# Patient Record
Sex: Female | Born: 1985 | Race: Black or African American | Hispanic: No | Marital: Single | Smoking: Never smoker
Health system: Southern US, Community
[De-identification: ages and names within clinical notes are randomized; demographics above are authoritative.]

## PROBLEM LIST (undated history)

## (undated) DIAGNOSIS — E042 Nontoxic multinodular goiter: Secondary | ICD-10-CM

## (undated) DIAGNOSIS — D649 Anemia, unspecified: Secondary | ICD-10-CM

## (undated) DIAGNOSIS — J189 Pneumonia, unspecified organism: Secondary | ICD-10-CM

## (undated) DIAGNOSIS — Z8619 Personal history of other infectious and parasitic diseases: Secondary | ICD-10-CM

## (undated) HISTORY — PX: FRACTURE SURGERY: SHX138

## (undated) HISTORY — DX: Nontoxic multinodular goiter: E04.2

## (undated) HISTORY — PX: APPENDECTOMY: SHX54

## (undated) HISTORY — DX: Anemia, unspecified: D64.9

## (undated) HISTORY — DX: Personal history of other infectious and parasitic diseases: Z86.19

## (undated) HISTORY — PX: HARDWARE REMOVAL: SHX979

---

## 2010-11-04 HISTORY — PX: TONSILLECTOMY AND ADENOIDECTOMY: SHX28

## 2012-04-04 LAB — HM PAP SMEAR: HM Pap smear: NORMAL

## 2012-10-07 ENCOUNTER — Other Ambulatory Visit (HOSPITAL_COMMUNITY): Payer: Self-pay | Admitting: Internal Medicine

## 2012-10-07 DIAGNOSIS — E042 Nontoxic multinodular goiter: Secondary | ICD-10-CM

## 2012-10-14 ENCOUNTER — Ambulatory Visit (HOSPITAL_COMMUNITY)
Admission: RE | Admit: 2012-10-14 | Discharge: 2012-10-14 | Disposition: A | Discharge status: Home / Self Care | Payer: Self-pay | Source: Ambulatory Visit | Attending: Internal Medicine | Admitting: Internal Medicine

## 2012-10-14 DIAGNOSIS — E042 Nontoxic multinodular goiter: Secondary | ICD-10-CM | POA: Insufficient documentation

## 2012-10-14 NOTE — Procedures (Signed)
Bilat thyroid nodules bx Lt 4 cm Rt 2 cm 25 g x 3 for both  Pt tolerated well Path pending

## 2013-04-04 DIAGNOSIS — E042 Nontoxic multinodular goiter: Secondary | ICD-10-CM

## 2013-04-04 HISTORY — DX: Nontoxic multinodular goiter: E04.2

## 2013-06-28 ENCOUNTER — Ambulatory Visit: Payer: Self-pay | Admitting: Family

## 2013-07-12 ENCOUNTER — Encounter: Payer: Self-pay | Admitting: Family

## 2013-07-12 ENCOUNTER — Ambulatory Visit (INDEPENDENT_AMBULATORY_CARE_PROVIDER_SITE_OTHER): Payer: BC Managed Care – PPO | Admitting: Family

## 2013-07-12 VITALS — BP 124/80 | HR 85 | Temp 97.6°F | Resp 16 | Ht 67.5 in | Wt 302.1 lb

## 2013-07-12 DIAGNOSIS — E049 Nontoxic goiter, unspecified: Secondary | ICD-10-CM

## 2013-07-12 DIAGNOSIS — E042 Nontoxic multinodular goiter: Secondary | ICD-10-CM | POA: Insufficient documentation

## 2013-07-12 NOTE — Assessment & Plan Note (Signed)
Pt with multinodular goiter, and dysphagia. ? If she may be candidate for thyroidectomy due to dysphagia. Will defer to endo. Refer to Dr. Elvera Lennox.  Obtain TFT's.

## 2013-07-12 NOTE — Patient Instructions (Addendum)
Please complete your lab work prior to leaving.  You will be contacted about your referral to endocrinology.  Please let us know if you have not heard back within 1 week about your referral. Schedule a fasting physical in 1 month. Welcome to [facility]!

## 2013-07-12 NOTE — Progress Notes (Signed)
Subjective:    Patient ID: Diamond [name], female    DOB: 1986/10/24, 27 y.o.   MRN: [id number]  HPI  Diamond [name] is a 27 yr old female who presents today to establish care.  Reports that she has had an enlarged thyroid for 2-3 years.  Did not have insurance.  She reports that she had weight gain during college.  Denies associated palpitations or night sweats.  She does report associated dysphagia.  Thyroid nodule- had biopsy last year which was benign.  Per pt she was told that her thyroid function was normal.  She has some dysphagia from the goiter.  Reports that this has been ongoing for a few weeks.    Review of Systems  Constitutional: Negative for unexpected weight change.  HENT: Negative for hearing loss and congestion.   Eyes: Negative for visual disturbance.  Respiratory:       Reports recent uri with cough which is resolving  Cardiovascular: Negative for leg swelling.  Gastrointestinal: Negative for nausea, vomiting and diarrhea.  Genitourinary: Negative for dysuria, frequency, hematuria and menstrual problem.  Musculoskeletal: Negative for myalgias and arthralgias.  Skin: Negative for rash.  Neurological: Negative for headaches.  Hematological: Negative for adenopathy.  Psychiatric/Behavioral:       Denies depression/anxiety   Past Medical History  Diagnosis Date  . History of chicken pox   . Multiple thyroid nodules 04/2013    3 nodules per pt    History   Social History  . Marital Status: Single    Spouse Name: N/A    Number of Children: N/A  . Years of Education: N/A   Occupational History  . Not on file.   Social History Main Topics  . Smoking status: Never Smoker   . Smokeless tobacco: Never Used  . Alcohol Use: Yes     Comment: occasional  . Drug Use: Not on file  . Sexual Activity: Not on file   Other Topics Concern  . 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 Appendectomy      "teenager"  . Tonsillectomy and adenoidectomy  2012  . Fracture surgery Right     "compound fracture of leg per pt"    Family History  Problem Relation Age of Onset  . Thyroid disease Mother     " had thyroid removed due to nodules per pt"  . Hyperlipidemia Father   . Hypertension Father   . Diabetes Father   . Cancer Paternal Grandmother     lung  . Hyperlipidemia Paternal Grandmother   . Hypertension Paternal Grandmother   . Diabetes Paternal Grandmother     Allergies  Allergen Reactions  . Penicillins Rash    No current outpatient prescriptions on file prior to visit.   No current facility-administered medications on file prior to visit.    BP 124/80  Pulse 85  Temp(Src) 97.6 F (36.4 C) (Oral)  Resp 16  Ht 5' 7.5" (1.715 m)  Wt 302 lb 1.3 oz (137.023 kg)  BMI 46.59 kg/m2  SpO2 99%  LMP 06/28/2013  Objective:   Physical Exam  Constitutional: She is oriented to person, place, and time. She appears well-developed and well-nourished. No distress.  HENT:  Head: Normocephalic and atraumatic.  Neck:  + thyromegally  Cardiovascular: Normal rate and regular rhythm.   No murmur heard. Pulmonary/Chest: Effort normal and breath sounds normal. No respiratory distress. She has no wheezes. She has no rales. She exhibits no tenderness.  Musculoskeletal: She exhibits no edema.  Neurological: She is alert and oriented to person, place, and time.  Psychiatric: She has a normal mood and affect. Her behavior is normal. Judgment and thought content normal.          Assessment & Plan:

## 2013-07-13 ENCOUNTER — Encounter: Payer: Self-pay | Admitting: Family

## 2013-08-09 ENCOUNTER — Ambulatory Visit (INDEPENDENT_AMBULATORY_CARE_PROVIDER_SITE_OTHER): Payer: BC Managed Care – PPO | Admitting: Internal Medicine

## 2013-08-09 ENCOUNTER — Encounter: Payer: Self-pay | Admitting: Internal Medicine

## 2013-08-09 VITALS — BP 112/64 | HR 81 | Temp 98.1°F | Resp 12 | Ht 69.0 in | Wt 307.0 lb

## 2013-08-09 DIAGNOSIS — E042 Nontoxic multinodular goiter: Secondary | ICD-10-CM

## 2013-08-09 NOTE — Progress Notes (Signed)
Patient ID: Diamond [name], female   DOB: 1986/01/10, 27 y.o.   MRN: [id number]   HPI  Diamond [name] is a 27 y.o.-year-old female, referred by her PCP, Diamond [name], for evaluation for MNG.  She has been living in [place] before, moved here 1.5 years ago and established care at [facility] recently.  Patient was diagnosed with enlarged thyroid approximately 10 mo ago - at [facility]. She had a thyroid ultrasound that showed 3 thyroid nodules. The 2 largest nodules were biopsied >> benign (10/2012). I was able to review the results of her thyroid biopsies, however not her thyroid ultrasound images or report, as this is not in our computer system. Patient tells me that her hoarseness and dysphagia felt better after she had the FNA performed, but now it feels the same as before the FNA.    Pt feels a "lump" mid neck - for 2 years, + hoarseness for 2 years, + dysphagia - meats but also swallowing saliva laying down/no odynophagia, + SOB with lying down.  She had enlarged tonsils >> were getting enlarged every winter, but throat closing up every month >> steroids. Feels better after surgery.   Pt does have a FH of thyroid ds: mother had goiter >> s/p hyperthyroidism >> total thyroidism. No FH of thyroid cancer. No h/o radiation tx to head or neck.  I reviewed pt's thyroid latest tests: Lab Results  Component Value Date   TSH 1.087 07/12/2013   FREET4 1.27 07/12/2013   Pt c/o: - + both heat intolerance/cold intolrance - no tremors - no anxiety/depression - no palpitations - no problems with concentration - no fatigue - no hyperdefecation/constipation - no weight loss - no weight gain - no dry skin - no hair loss   No seaweed or kelp, no recent contrast studies. No steroid use recently. No herbal supplements.   I reviewed her chart and she also has a history of IDA 3 years ago. She will have labs to investigate this later this month.  ROS: Constitutional: no weight  gain/loss, no fatigue, no subjective hyperthermia/hypothermia Eyes: no blurry vision, no xerophthalmia ENT: no sore throat, + nodules palpated in throat, + dysphagia/no odynophagia, no hoarseness Cardiovascular: no CP/SOB/palpitations/leg swelling Respiratory: no cough/SOB Gastrointestinal: no N/V/D/C Musculoskeletal: no muscle/joint aches Skin: no rashes Neurological: no tremors/numbness/tingling/dizziness Psychiatric: no depression/anxiety  Past Medical History  Diagnosis Date  . History of chicken pox   . Multiple thyroid nodules 04/2013    3 nodules per pt   Past Surgical History  Procedure Laterality Date  . Appendectomy      "teenager"  . Tonsillectomy and adenoidectomy  2012  . Fracture surgery Right     "compound fracture of leg per pt"   History   Social History  . Marital Status: Single    Spouse Name: N/A    Number of Children: 0   Occupational History  . [employer] consultant   Social History Main Topics  . Smoking status: Never Smoker   . Smokeless tobacco: Never Used  . Alcohol Use: Yes     Comment: occasional - wine  . Drug Use: No  . 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   No current facility-administered medications on file prior to visit.   Allergies  Allergen Reactions  . Penicillins Rash   Family History  Problem Relation Age of Onset  . Thyroid disease Mother     " had thyroid removed due to nodules per pt"  . Hyperlipidemia Father   . Hypertension Father   . Diabetes Father   . Cancer Paternal Grandmother     lung  .  Hyperlipidemia Paternal Grandmother   . Hypertension Paternal Grandmother   . Diabetes Paternal Grandmother    PE: BP 112/64  Pulse 81  Temp(Src) 98.1 F (36.7 C) (Oral)  Resp 12  Ht 5\' 9"  (1.753 m)  Wt 307 lb (139.254 kg)  BMI 45.32 kg/m2  SpO2 99%  LMP 06/28/2013 Wt Readings from Last 3 Encounters:  08/09/13 307 lb (139.254 kg)  07/12/13 302 lb 1.3 oz (137.023 kg)   Constitutional: overweight, in NAD Eyes: PERRLA, EOMI, + exophthalmos  ENT: moist mucous membranes, + thyromegaly L>R, no cervical lymphadenopathy Cardiovascular: RRR, No MRG Respiratory: CTA B Gastrointestinal: abdomen soft, NT, ND, BS+ Musculoskeletal: no deformities, strength intact in all 4;  Skin: moist, warm, no rashes Neurological: very slight tremor with outstretched hands, DTR normal in all 4  ASSESSMENT: 1. MNG - thyroid FNA (10/2012):   left sided 4 cm nodule: benign, with Hurthle cell and cystic changes  right-sided 2 cm nodule: benign, with Hurthle cell and cystic changes  Plan: 1. MNG  - I reviewed the results of her thyroid FNA reports along with the patient. I do not have the records of her previous thyroid U/S and she signed a release of information consent for to get this from [facility]. Due to her report that her dysphagia and her hoarseness felt better after she had the FNA performed, I wonder whether this could have been due to fluid removal from cysts. - she has neck compression symptoms, mostly dysphagia and hoarseness but also SOB with lying down.I suggested to check a barium swallow to see if the compression is bilateral, in that case we need total thyroidectomy. Otherwise, we might be able to only do hemithyroidectomy. We would obviously also have input from surgery in this matter. If no thyroid compression, we might need a flow-loop curve to decide for or against thyroidectomy. Pt understands and agrees with the plan. - I'll see her back in 3 mo, ideally ~6-8 weeks after the  surgery - I advised pt to join my chart and I will send her the results through there   Orders Placed This Encounter  Procedures  . DG Esophagus   08/30/2013 CLINICAL DATA: Multinodular goiter. Dysphagia. Neck compression symptoms.  EXAM: ESOPHOGRAM / BARIUM SWALLOW / BARIUM TABLET STUDY  TECHNIQUE: Combined double contrast and single contrast examination performed using effervescent crystals, thick barium liquid, and thin barium liquid. The patient was observed with fluoroscopy swallowing a 13mm barium sulphate tablet.  COMPARISON: None.  FLUOROSCOPY TIME: 1 min, 48 seconds  FINDINGS: Fluoroscopic evaluation of swallowing demonstrates normal esophageal peristalsis. There is slight impression on the anterior lower cervical esophagus, possibly related to the patient's known multinodular goiter. Upon swallow in the 13 mm tablet. This briefly sticks in the cervical esophagus, then passes distally with additional water sips. This does reproduce the patient's symptoms.  Otherwise, esophagus is unremarkable. No additional fixed stricture, fold thickening or mass. No hiatal hernia. No reflux with the water siphon maneuver.  IMPRESSION: Slight impression on the anterior lower cervical esophagus, possibly related to the patient's known multinodular goiter. Brief sticking of the 13 mm barium tablet in the cervical esophagus.  Electronically Signed By: Charlett Nose M.D. On: 08/27/2013 12:01  08/31/2013 Received records of patient's previous thyroid ultrasound at [facility]: 09/09/2012:  Right lobe is 7.3 x 2.5 x 2.2 cm. On the right, there is a solid 2.2 x 1.2 x 1.6 cm mass. The second mass measures 1.0 x 1.2 x 1.2 cm. A third mass measures 1.6 x 1.1 x 1.3 cm. There are several subcentimeter masses on the right as well.  Left lobe is 8.0 x 3.8 x 2.8 cm There is a mass in the left lobe measuring 5.0 x 4.9 x 3.9 cm, with few tiny calcifications.  Isthmus is 8 mm in  thickness.  In the isthmus, there is a mass measuring 1.8 x 1.7 x 1.4 cm all masses are solid. Enlarged thyroid with multiple solid masses.  I tried to call the patient but could only leave a message. I send her the following message to my chart: Dear Ms Diamond [name], I received the results of the Barium swallow test: it shows that the goiter is actually compressing your esophagus so most likely your problems with swallowing are caused by the thyroid. Reviewing the ultrasound report, there is no cyst that may be aspirated, but indeed, he has several large nodules in the thyroid. In this case, my suggestion is to undergo thyroidectomy, which is the surgery to take the thyroid out, as we discussed at last visit. Please let me know what you think. I would not place a surgery referral before you let me know your thoughts and if you have any questions. Please see below the results of the barium swallow and a thyroid ultrasound. Sincerely, Carlus Pavlov MD  Patient called back and is able to discuss with her about the above >> agrees with referral to surgery.

## 2013-08-09 NOTE — Patient Instructions (Addendum)
You will be called with the appointment for a Barium swallow (a test to see if the thyroid is pressing on your esophagus). I will send you the results through My Chart - please join - see instructions.  Please return in 3 months.

## 2013-08-16 ENCOUNTER — Encounter: Payer: BC Managed Care – PPO | Admitting: Family

## 2013-08-23 ENCOUNTER — Other Ambulatory Visit: Payer: Self-pay | Admitting: Family

## 2013-08-23 ENCOUNTER — Encounter: Payer: Self-pay | Admitting: Family

## 2013-08-23 ENCOUNTER — Telehealth: Payer: Self-pay | Admitting: *Deleted

## 2013-08-23 ENCOUNTER — Other Ambulatory Visit (HOSPITAL_COMMUNITY)
Admission: RE | Admit: 2013-08-23 | Discharge: 2013-08-23 | Disposition: A | Discharge status: Home / Self Care | Payer: BC Managed Care – PPO | Source: Ambulatory Visit | Attending: Family | Admitting: Family

## 2013-08-23 ENCOUNTER — Ambulatory Visit (INDEPENDENT_AMBULATORY_CARE_PROVIDER_SITE_OTHER): Payer: BC Managed Care – PPO | Admitting: Family

## 2013-08-23 VITALS — BP 116/80 | HR 73 | Temp 97.9°F | Resp 16 | Ht 69.0 in | Wt 305.1 lb

## 2013-08-23 DIAGNOSIS — Z113 Encounter for screening for infections with a predominantly sexual mode of transmission: Secondary | ICD-10-CM | POA: Insufficient documentation

## 2013-08-23 DIAGNOSIS — Z0000 Encounter for general adult medical examination without abnormal findings: Secondary | ICD-10-CM

## 2013-08-23 DIAGNOSIS — N889 Noninflammatory disorder of cervix uteri, unspecified: Secondary | ICD-10-CM | POA: Insufficient documentation

## 2013-08-23 DIAGNOSIS — Z01419 Encounter for gynecological examination (general) (routine) without abnormal findings: Secondary | ICD-10-CM | POA: Insufficient documentation

## 2013-08-23 DIAGNOSIS — Z1151 Encounter for screening for human papillomavirus (HPV): Secondary | ICD-10-CM | POA: Insufficient documentation

## 2013-08-23 HISTORY — DX: Encounter for general adult medical examination without abnormal findings: Z00.00

## 2013-08-23 HISTORY — DX: Noninflammatory disorder of cervix uteri, unspecified: N88.9

## 2013-08-23 NOTE — Progress Notes (Signed)
Subjective:    Patient ID: Diamond [name], female    DOB: December 16, 1985, 27 y.o.   MRN: [id number]  HPI  Patient presents today for complete physical.  Immunizations: declines flu shot Diet: reports diet is poor. Eats late night.   Exercise: only exercising 1 time a week.  Works 5-6 days a week.   Pap Smear:  2 years ago.  LMP 1 week ago.      Review of Systems  Constitutional: Positive for unexpected weight change.  HENT: Negative for rhinorrhea.   Eyes: Negative for visual disturbance.  Respiratory: Negative for cough and shortness of breath.   Cardiovascular: Negative for chest pain.  Gastrointestinal: Negative for nausea, vomiting and diarrhea.  Genitourinary: Negative for dysuria and frequency.  Musculoskeletal: Negative for arthralgias and myalgias.  Skin: Negative for rash.  Neurological: Negative for headaches.  Hematological:       Thyromegaly   Psychiatric/Behavioral:       Denies depression/anxiety   Past Medical History  Diagnosis Date  . History of chicken pox   . Multiple thyroid nodules 04/2013    3 nodules per pt    History   Social History  . Marital Status: Single    Spouse Name: N/A    Number of Children: N/A  . Years of Education: N/A   Occupational History  . Not on file.   Social History Main Topics  . Smoking status: Never Smoker   . Smokeless tobacco: Never Used  . Alcohol Use: 1.0 oz/week    2 drink(s) per week     Comment: occasional  . Drug Use: No  . Sexual Activity: Yes    Partners: Male   Other Topics Concern  . 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 Appendectomy      "teenager"  . Tonsillectomy and adenoidectomy  2012  . Fracture surgery Right     "compound fracture of leg per pt"    Family History  Problem Relation Age of Onset  . Thyroid disease Mother     " had thyroid removed due to nodules per pt"  . Hyperlipidemia Father   . Hypertension Father   . Diabetes Father   . Cancer Paternal Grandmother     lung  . Hyperlipidemia Paternal Grandmother   . Hypertension Paternal Grandmother   . Diabetes Paternal Grandmother     Allergies  Allergen Reactions  . Penicillins Rash    No current outpatient prescriptions on file prior to visit.   No current facility-administered medications on file prior to visit.    BP 116/80  Pulse 73  Temp(Src) 97.9 F (36.6 C) (Oral)  Resp 16  Ht 5\' 9"  (1.753 m)  Wt 305 lb 1.3 oz (138.383 kg)  BMI 45.03 kg/m2  SpO2 99%  LMP 08/16/2013        Objective:   Physical Exam  Physical Exam  Constitutional: Pleasant, morbidly obese AA female. She is oriented to person, place,  and time. She appears well-developed and well-nourished. No distress.  HENT:  Head: Normocephalic and atraumatic.  Right Ear: Tympanic membrane and ear canal normal.  Left Ear: Tympanic membrane and ear canal normal.  Mouth/Throat: Oropharynx is clear and moist.  Eyes: Pupils are equal, round, and reactive to light. No scleral icterus.  Neck: Normal range of motion. + thyromegaly is note. Cardiovascular: Normal rate and regular rhythm.   No murmur heard. Pulmonary/Chest: Effort normal and breath sounds normal. No respiratory distress. He has no wheezes. She has no rales. She exhibits no tenderness.  Abdominal: Soft. Bowel sounds are normal. He exhibits no distension and no mass. There is no tenderness. There is no rebound and no guarding.  Musculoskeletal: She exhibits no edema.  Lymphadenopathy:    She has no cervical adenopathy.  Neurological: She is alert and oriented to person,  place, and time. She has normal reflexes. She exhibits normal muscle tone. Coordination normal.  Skin: Skin is warm and dry.  Psychiatric: She has a normal mood and affect. Her behavior is normal. Judgment and thought content normal.  Breasts: Examined lying Right: Without masses, retractions, discharge or axillary adenopathy.  Left: Without masses, retractions, discharge or axillary adenopathy.  Inguinal/mons: Normal without inguinal adenopathy  External genitalia: Normal  BUS/Urethra/Skene's glands: Normal  Bladder: Normal  Vagina: Normal  Cervix: pedunculated lesion noted at 12 oclock Uterus: normal in size, shape and contour. Midline and mobile  Adnexa/parametria:  Rt: Without masses or tenderness.  Lt: Without masses or tenderness.  Anus and perineum: Normal           Assessment & Plan:           Assessment & Plan:

## 2013-08-23 NOTE — Assessment & Plan Note (Signed)
Noted on exam to have cervical lesion. Will reer to GYN for further evaluation.

## 2013-08-23 NOTE — Assessment & Plan Note (Signed)
Discussed healthy diet, exercise, weight loss. Declines flu shot. She will return fasting for labs.  Pap performed today.

## 2013-08-23 NOTE — Telephone Encounter (Signed)
Received call from [facility] that GYN referral needs correction of dx code. Previous referral cancelled and re-entered with appropriate dx.

## 2013-08-23 NOTE — Addendum Note (Signed)
Addended by: Mervin Kung A on: 08/23/2013 11:09 AM   Modules accepted: Orders

## 2013-08-23 NOTE — Patient Instructions (Signed)
Please return fasting for lab work. Work hard on diet, exercise, weight loss. Try to exercise 30 minutes a day 5 days a week.  Count calories using an online App like Myfitness Pal or Weight watchers. Aim to lose 1 pound a week.  Follow up in 6 months so we can see how you are doing with weight loss.

## 2013-08-24 LAB — CBC WITH DIFFERENTIAL/PLATELET
Basophils Relative: 0 % (ref 0–1)
Eosinophils Absolute: 0.2 10*3/uL (ref 0.0–0.7)
Eosinophils Relative: 3 % (ref 0–5)
Hemoglobin: 9.8 g/dL — ABNORMAL LOW (ref 12.0–15.0)
Lymphs Abs: 2.2 10*3/uL (ref 0.7–4.0)
MCH: 23.5 pg — ABNORMAL LOW (ref 26.0–34.0)
MCHC: 31.8 g/dL (ref 30.0–36.0)
MCV: 73.9 fL — ABNORMAL LOW (ref 78.0–100.0)
Monocytes Relative: 7 % (ref 3–12)
Neutrophils Relative %: 50 % (ref 43–77)
Platelets: 399 10*3/uL (ref 150–400)
RBC: 4.17 MIL/uL (ref 3.87–5.11)

## 2013-08-24 LAB — BASIC METABOLIC PANEL WITH GFR
BUN: 8 mg/dL (ref 6–23)
CO2: 26 mEq/L (ref 19–32)
Calcium: 9.3 mg/dL (ref 8.4–10.5)
GFR, Est African American: >89 mL/min
Glucose, Bld: 98 mg/dL (ref 70–99)
Sodium: 141 mEq/L (ref 135–145)

## 2013-08-24 LAB — LIPID PANEL: Cholesterol: 164 mg/dL (ref 0–200)

## 2013-08-24 LAB — URINALYSIS, ROUTINE W REFLEX MICROSCOPIC
Bilirubin Urine: NEGATIVE
Glucose, UA: NEGATIVE mg/dL
Leukocytes, UA: NEGATIVE
Specific Gravity, Urine: 1.009 (ref 1.005–1.030)
pH: 6.5 (ref 5.0–8.0)

## 2013-08-24 LAB — HEPATIC FUNCTION PANEL
AST: 14 U/L (ref 0–37)
Albumin: 4.1 g/dL (ref 3.5–5.2)
Alkaline Phosphatase: 57 U/L (ref 39–117)
Total Protein: 6.8 g/dL (ref 6.0–8.3)

## 2013-08-25 LAB — IRON AND TIBC
%SAT: 5 % — ABNORMAL LOW (ref 20–55)
Iron: 19 ug/dL — ABNORMAL LOW (ref 42–145)
TIBC: 368 ug/dL (ref 250–470)
UIBC: 349 ug/dL (ref 125–400)

## 2013-08-27 ENCOUNTER — Telehealth: Payer: Self-pay | Admitting: Family

## 2013-08-27 ENCOUNTER — Ambulatory Visit
Admission: RE | Admit: 2013-08-27 | Discharge: 2013-08-27 | Disposition: A | Discharge status: Home / Self Care | Payer: BC Managed Care – PPO | Source: Ambulatory Visit | Attending: Internal Medicine | Admitting: Internal Medicine

## 2013-08-27 DIAGNOSIS — D509 Iron deficiency anemia, unspecified: Secondary | ICD-10-CM

## 2013-08-30 ENCOUNTER — Telehealth: Payer: Self-pay | Admitting: *Deleted

## 2013-08-30 ENCOUNTER — Other Ambulatory Visit: Payer: BC Managed Care – PPO

## 2013-08-30 NOTE — Telephone Encounter (Addendum)
Please call pt and let her know that blood work shows iron deficiency anemia.  I would like her to add iron supplement 325mg  twice daily and plan to repeat  CBC diff, + serum iron in 2 months, dx is iron def anemia.  Pap smear, gonorrhea, chlamydia and HPV is negative. Other labs look good.

## 2013-08-30 NOTE — Telephone Encounter (Signed)
Called [facility] and spoke with someone in medical records. She stated that the pt had her U/S done at [facility].

## 2013-08-30 NOTE — Telephone Encounter (Signed)
Left detailed message on cell# and to call if any questions.  Lab order entered. 

## 2013-08-31 ENCOUNTER — Encounter: Payer: Self-pay | Admitting: Internal Medicine

## 2013-09-06 ENCOUNTER — Telehealth: Payer: Self-pay | Admitting: *Deleted

## 2013-09-06 NOTE — Telephone Encounter (Signed)
Received message from pt stating the pharmacy did not receive Rx for her iron pills.  Attempted to reach pt and left detailed message on cell# that iron may be purchased over the counter and to call if she has any further questions or concerns.

## 2013-09-10 ENCOUNTER — Ambulatory Visit (INDEPENDENT_AMBULATORY_CARE_PROVIDER_SITE_OTHER): Payer: BC Managed Care – PPO | Admitting: Surgery

## 2013-09-10 ENCOUNTER — Encounter (INDEPENDENT_AMBULATORY_CARE_PROVIDER_SITE_OTHER): Payer: Self-pay | Admitting: Surgery

## 2013-09-10 VITALS — BP 140/90 | HR 72 | Temp 98.0°F | Resp 15 | Ht 69.0 in | Wt 303.2 lb

## 2013-09-10 DIAGNOSIS — E042 Nontoxic multinodular goiter: Secondary | ICD-10-CM

## 2013-09-10 NOTE — Progress Notes (Signed)
General Surgery [facility]  Chief Complaint  Patient presents with  . New Evaluation    eval multinodular goiter - referral from Dr. Carlus Pavlov    HISTORY: The patient is a 27 year old female referred by her endocrinologist for multinodular thyroid goiter with compressive symptoms. Patient was initially diagnosed in [place] approximately 2 years ago. She has multiple thyroid nodules. By report she is undergone fine-needle aspiration biopsy which showed benign cytopathology. Patient was evaluated by her endocrinologist. She has multiple complaints of compressive symptoms including dysphagia with both solids and liquids, dyspnea when in a recumbent position, and mild discomfort. Patient underwent a barium swallow on 08/27/2013. This shows a slight impression in the cervical esophagus. A 13 mm barium tablet did not pass through this region easily and required sips of water in order for the tablet to pass. This process did reproduce the patient's symptoms of dysphagia. Patient is now referred for consideration for total thyroidectomy for multinodular thyroid goiter with compressive symptoms.  Patient has had no prior history of head or neck surgery. She has never been on thyroid medication. She is accompanied by her mother who underwent total thyroidectomy a little over one year ago for multinodular thyroid goiter. Her procedure was performed in [place]. There is no family history of thyroid cancer. There is no family history of other endocrine neoplasm.  Past Medical History  Diagnosis Date  . History of chicken pox   . Multiple thyroid nodules 04/2013    3 nodules per pt  . Anemia     Current Outpatient Prescriptions  Medication Sig Dispense Refill  . ferrous sulfate 325 (65 FE) MG tablet Take 325 mg by mouth 2 (two) times daily.       No current facility-administered medications for this visit.    Allergies  Allergen Reactions  . Penicillins Rash    Family  History  Problem Relation Age of Onset  . Thyroid disease Mother     " had thyroid removed due to nodules per pt"  . Hyperlipidemia Father   . Hypertension Father   . Diabetes Father   . Cancer Paternal Grandmother     lung  . Hyperlipidemia Paternal Grandmother   . Hypertension Paternal Grandmother   . Diabetes Paternal Grandmother     History   Social History  . Marital Status: Single    Spouse Name: N/A    Number of Children: N/A  . Years of Education: N/A   Social History Main Topics  . Smoking status: Never Smoker   . Smokeless tobacco: Never Used  . Alcohol Use: 1.0 oz/week    2 drink(s) per week     Comment: occasional  . Drug Use: No  . Sexual Activity: Yes    Partners: Male   Other Topics Concern  . 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 Denies palpitations. Moderate dysphagia. Mild dyspnea. Complains of occasional hoarseness and raspiness of voice.  EXAM: Filed  Vitals:   09/10/13 1547  BP: 140/90  Pulse: 72  Temp: 98 F (36.7 C)  Resp: 15    HEENT: normocephalic; pupils equal and reactive; sclerae clear; dentition good; mucous membranes moist NECK:  Anterior neck is full with an enlarged soft multinodular gland; left thyroid lobe is most prominent with a palpable 3 cm nodule in the anterior or left lobe; asymmetric on extension; no palpable anterior or posterior cervical lymphadenopathy; no supraclavicular masses; no tenderness CHEST: clear to auscultation bilaterally without rales, rhonchi, or wheezes CARDIAC: regular rate and rhythm without significant murmur;  peripheral pulses are full EXT:  non-tender without edema; no deformity NEURO: no gross focal deficits; no sign of tremor   LABORATORY RESULTS: See [facility] (CHL-Epic) for most recent results  RADIOLOGY RESULTS: See [facility] (CHL-Epic) for most recent results  IMPRESSION: Multinodular thyroid goiter with compressive symptoms  PLAN: I discussed the above findings at length with the patient and her mother. We reviewed her barium swallow study results. I have recommended total thyroidectomy for management of multinodular goiter with compressive symptoms. We have discussed the risk and benefits of the procedure including the potential for injury to recurrent laryngeal nerves and parathyroid glands. We have discussed the hospital stay to be anticipated. We have discussed her recovery in time out of work. Patient understands and wishes to proceed with surgery in the near future. We will schedule her procedure at a time convenient for the patient.  The risks and benefits of the procedure have been discussed at length with the patient.  The patient understands the proposed procedure, potential alternative treatments, and the course of recovery to be expected.  All of the patient's questions have been answered at this time.  The patient wishes to proceed with surgery.  Velora Heckler, MD, FACS General & Endocrine Surgery [facility]  Primary Care Physician: Lemont Fillers., NP

## 2013-09-10 NOTE — Patient Instructions (Signed)

## 2013-09-23 ENCOUNTER — Encounter (HOSPITAL_COMMUNITY): Payer: Self-pay | Admitting: Pharmacy Technician

## 2013-09-27 ENCOUNTER — Ambulatory Visit (HOSPITAL_COMMUNITY)
Admission: RE | Admit: 2013-09-27 | Discharge: 2013-09-27 | Disposition: A | Discharge status: Home / Self Care | Payer: BC Managed Care – PPO | Source: Ambulatory Visit | Attending: Surgery | Admitting: Surgery

## 2013-09-27 ENCOUNTER — Encounter (HOSPITAL_COMMUNITY)
Admission: RE | Admit: 2013-09-27 | Discharge: 2013-09-27 | Disposition: A | Discharge status: Home / Self Care | Payer: BC Managed Care – PPO | Source: Ambulatory Visit | Attending: Surgery | Admitting: Surgery

## 2013-09-27 ENCOUNTER — Encounter (HOSPITAL_COMMUNITY): Payer: Self-pay

## 2013-09-27 DIAGNOSIS — Z01818 Encounter for other preprocedural examination: Secondary | ICD-10-CM | POA: Insufficient documentation

## 2013-09-27 DIAGNOSIS — Z01812 Encounter for preprocedural laboratory examination: Secondary | ICD-10-CM | POA: Insufficient documentation

## 2013-09-27 DIAGNOSIS — E042 Nontoxic multinodular goiter: Secondary | ICD-10-CM | POA: Insufficient documentation

## 2013-09-27 HISTORY — DX: Pneumonia, unspecified organism: J18.9

## 2013-09-27 LAB — CBC
HCT: 32.2 % — ABNORMAL LOW (ref 36.0–46.0)
Hemoglobin: 9.9 g/dL — ABNORMAL LOW (ref 12.0–15.0)
MCV: 76.8 fL — ABNORMAL LOW (ref 78.0–100.0)
RBC: 4.19 MIL/uL (ref 3.87–5.11)
RDW: 16.3 % — ABNORMAL HIGH (ref 11.5–15.5)
WBC: 6.3 10*3/uL (ref 4.0–10.5)

## 2013-09-27 NOTE — Patient Instructions (Signed)
20 Diamond [name]  09/27/2013   Your procedure is scheduled on: 10/08/13  Report to [facility] at 7:00 AM.  Call this number if you have problems the morning of surgery 336-: [phone]   Remember:   Do not eat food or drink liquids After Midnight.   Do not wear jewelry, make-up or nail polish.  Do not wear lotions, powders, or perfumes. You may wear deodorant.  Do not shave 48 hours prior to surgery. Men may shave face and neck.  Do not bring valuables to the hospital.  Contacts, dentures or bridgework may not be worn into surgery.  Leave suitcase in the car. After surgery it may be brought to your room.  For patients admitted to the hospital, checkout time is 11:00 AM the day of discharge.  Birdie Sons, RN  pre op nurse call if needed [phone]    FAILURE TO FOLLOW THESE INSTRUCTIONS MAY RESULT IN CANCELLATION OF YOUR SURGERY   Patient Signature: ___________________________________________

## 2013-09-28 NOTE — Progress Notes (Signed)
Quick Note:  These results are acceptable for scheduled surgery.  Cashae Weich M. Daxx Tiggs, MD, FACS [facility] Office: [phone]   ______ 

## 2013-10-08 ENCOUNTER — Ambulatory Visit (HOSPITAL_COMMUNITY): Payer: BC Managed Care – PPO | Admitting: Certified Registered"

## 2013-10-08 ENCOUNTER — Observation Stay (HOSPITAL_COMMUNITY)
Admission: RE | Admit: 2013-10-08 | Discharge: 2013-10-10 | Disposition: A | Discharge status: Home / Self Care | Payer: BC Managed Care – PPO | Source: Ambulatory Visit | Attending: Surgery | Admitting: Surgery

## 2013-10-08 ENCOUNTER — Encounter (HOSPITAL_COMMUNITY): Payer: Self-pay | Admitting: *Deleted

## 2013-10-08 ENCOUNTER — Encounter (HOSPITAL_COMMUNITY)
Admission: RE | Disposition: A | Discharge status: Home / Self Care | Payer: Self-pay | Source: Ambulatory Visit | Attending: Surgery

## 2013-10-08 ENCOUNTER — Encounter (HOSPITAL_COMMUNITY): Payer: BC Managed Care – PPO | Admitting: Certified Registered"

## 2013-10-08 DIAGNOSIS — N9989 Other postprocedural complications and disorders of genitourinary system: Secondary | ICD-10-CM | POA: Insufficient documentation

## 2013-10-08 DIAGNOSIS — E042 Nontoxic multinodular goiter: Secondary | ICD-10-CM

## 2013-10-08 DIAGNOSIS — IMO0002 Reserved for concepts with insufficient information to code with codable children: Secondary | ICD-10-CM | POA: Insufficient documentation

## 2013-10-08 DIAGNOSIS — Y836 Removal of other organ (partial) (total) as the cause of abnormal reaction of the patient, or of later complication, without mention of misadventure at the time of the procedure: Secondary | ICD-10-CM | POA: Insufficient documentation

## 2013-10-08 DIAGNOSIS — R339 Retention of urine, unspecified: Secondary | ICD-10-CM | POA: Insufficient documentation

## 2013-10-08 HISTORY — PX: THYROIDECTOMY: SHX17

## 2013-10-08 SURGERY — THYROIDECTOMY
Anesthesia: General | Site: Neck

## 2013-10-08 MED ORDER — FENTANYL CITRATE 0.05 MG/ML IJ SOLN
INTRAMUSCULAR | Status: AC
  Filled 2013-10-08: qty 5

## 2013-10-08 MED ORDER — METOCLOPRAMIDE HCL 5 MG/ML IJ SOLN
INTRAMUSCULAR | Status: DC | PRN
  Administered 2013-10-08: 10 mg via INTRAVENOUS

## 2013-10-08 MED ORDER — HYDROMORPHONE HCL PF 1 MG/ML IJ SOLN
0.2500 mg | INTRAMUSCULAR | Status: DC | PRN
  Administered 2013-10-08: 0.25 mg via INTRAVENOUS

## 2013-10-08 MED ORDER — HYDROMORPHONE HCL PF 1 MG/ML IJ SOLN
1.0000 mg | INTRAMUSCULAR | Status: DC | PRN
  Administered 2013-10-08 – 2013-10-09 (×4): 1 mg via INTRAVENOUS
  Filled 2013-10-08 (×4): qty 1

## 2013-10-08 MED ORDER — LIDOCAINE HCL (CARDIAC) 20 MG/ML IV SOLN
INTRAVENOUS | Status: DC | PRN
  Administered 2013-10-08: 60 mg via INTRAVENOUS

## 2013-10-08 MED ORDER — LACTATED RINGERS IV SOLN
INTRAVENOUS | Status: DC
  Administered 2013-10-08: 13:00:00 via INTRAVENOUS

## 2013-10-08 MED ORDER — FENTANYL CITRATE 0.05 MG/ML IJ SOLN
INTRAMUSCULAR | Status: DC | PRN
  Administered 2013-10-08 (×3): 50 ug via INTRAVENOUS
  Administered 2013-10-08: 150 ug via INTRAVENOUS
  Administered 2013-10-08: 50 ug via INTRAVENOUS
  Administered 2013-10-08: 100 ug via INTRAVENOUS
  Administered 2013-10-08 (×2): 50 ug via INTRAVENOUS
  Administered 2013-10-08: 100 ug via INTRAVENOUS
  Administered 2013-10-08 (×2): 50 ug via INTRAVENOUS

## 2013-10-08 MED ORDER — ONDANSETRON HCL 4 MG/2ML IJ SOLN
INTRAMUSCULAR | Status: AC
  Filled 2013-10-08: qty 2

## 2013-10-08 MED ORDER — LEVOTHYROXINE SODIUM 100 MCG PO TABS
100.0000 ug | ORAL_TABLET | Freq: Every day | ORAL | Status: DC
  Administered 2013-10-09 – 2013-10-10 (×2): 100 ug via ORAL
  Filled 2013-10-08 (×3): qty 1

## 2013-10-08 MED ORDER — SODIUM CHLORIDE 0.9 % IR SOLN
Status: DC | PRN
  Administered 2013-10-08: 1000 mL

## 2013-10-08 MED ORDER — ROCURONIUM BROMIDE 100 MG/10ML IV SOLN
INTRAVENOUS | Status: AC
  Filled 2013-10-08: qty 1

## 2013-10-08 MED ORDER — HYDROMORPHONE HCL PF 1 MG/ML IJ SOLN
INTRAMUSCULAR | Status: AC
  Filled 2013-10-08: qty 1

## 2013-10-08 MED ORDER — MIDAZOLAM HCL 2 MG/2ML IJ SOLN
INTRAMUSCULAR | Status: AC
  Filled 2013-10-08: qty 2

## 2013-10-08 MED ORDER — LACTATED RINGERS IV SOLN: INTRAVENOUS | Status: DC

## 2013-10-08 MED ORDER — NEOSTIGMINE METHYLSULFATE 1 MG/ML IJ SOLN
INTRAMUSCULAR | Status: DC | PRN
  Administered 2013-10-08: 5 mg via INTRAVENOUS

## 2013-10-08 MED ORDER — CIPROFLOXACIN IN D5W 400 MG/200ML IV SOLN
400.0000 mg | INTRAVENOUS | Status: AC
  Administered 2013-10-08: 400 mg via INTRAVENOUS

## 2013-10-08 MED ORDER — LIDOCAINE HCL (CARDIAC) 20 MG/ML IV SOLN
INTRAVENOUS | Status: AC
  Filled 2013-10-08: qty 5

## 2013-10-08 MED ORDER — ONDANSETRON HCL 4 MG PO TABS: 4.0000 mg | ORAL_TABLET | Freq: Four times a day (QID) | ORAL | Status: DC | PRN

## 2013-10-08 MED ORDER — PROPOFOL 10 MG/ML IV BOLUS
INTRAVENOUS | Status: AC
  Filled 2013-10-08: qty 20

## 2013-10-08 MED ORDER — NEOSTIGMINE METHYLSULFATE 1 MG/ML IJ SOLN
INTRAMUSCULAR | Status: AC
  Filled 2013-10-08: qty 10

## 2013-10-08 MED ORDER — HYDROCODONE-ACETAMINOPHEN 5-325 MG PO TABS
1.0000 | ORAL_TABLET | ORAL | Status: DC | PRN
  Administered 2013-10-08 – 2013-10-09 (×2): 1 via ORAL
  Administered 2013-10-09 – 2013-10-10 (×8): 2 via ORAL
  Filled 2013-10-08 (×3): qty 2
  Filled 2013-10-08: qty 1
  Filled 2013-10-08 (×6): qty 2
  Filled 2013-10-08: qty 1

## 2013-10-08 MED ORDER — PROPOFOL 10 MG/ML IV BOLUS
INTRAVENOUS | Status: DC | PRN
  Administered 2013-10-08: 50 mg via INTRAVENOUS
  Administered 2013-10-08: 200 mg via INTRAVENOUS

## 2013-10-08 MED ORDER — HEMOSTATIC AGENTS (NO CHARGE) OPTIME
TOPICAL | Status: DC | PRN
  Administered 2013-10-08: 1 via TOPICAL

## 2013-10-08 MED ORDER — GLYCOPYRROLATE 0.2 MG/ML IJ SOLN
INTRAMUSCULAR | Status: DC | PRN
  Administered 2013-10-08: .8 mg via INTRAVENOUS

## 2013-10-08 MED ORDER — ROCURONIUM BROMIDE 100 MG/10ML IV SOLN
INTRAVENOUS | Status: DC | PRN
  Administered 2013-10-08: 50 mg via INTRAVENOUS
  Administered 2013-10-08: 20 mg via INTRAVENOUS
  Administered 2013-10-08 (×3): 10 mg via INTRAVENOUS

## 2013-10-08 MED ORDER — MIDAZOLAM HCL 5 MG/5ML IJ SOLN
INTRAMUSCULAR | Status: DC | PRN
  Administered 2013-10-08: 2 mg via INTRAVENOUS

## 2013-10-08 MED ORDER — ACETAMINOPHEN 325 MG PO TABS: 650.0000 mg | ORAL_TABLET | ORAL | Status: DC | PRN

## 2013-10-08 MED ORDER — DEXAMETHASONE SODIUM PHOSPHATE 10 MG/ML IJ SOLN
INTRAMUSCULAR | Status: AC
  Filled 2013-10-08: qty 1

## 2013-10-08 MED ORDER — GLYCOPYRROLATE 0.2 MG/ML IJ SOLN
INTRAMUSCULAR | Status: AC
  Filled 2013-10-08: qty 3

## 2013-10-08 MED ORDER — LACTATED RINGERS IV SOLN
INTRAVENOUS | Status: DC | PRN
  Administered 2013-10-08 (×2): via INTRAVENOUS

## 2013-10-08 MED ORDER — SODIUM CHLORIDE 0.9 % IJ SOLN
INTRAMUSCULAR | Status: AC
  Filled 2013-10-08: qty 10

## 2013-10-08 MED ORDER — CIPROFLOXACIN IN D5W 400 MG/200ML IV SOLN
INTRAVENOUS | Status: AC
  Filled 2013-10-08: qty 200

## 2013-10-08 MED ORDER — DEXAMETHASONE SODIUM PHOSPHATE 10 MG/ML IJ SOLN
INTRAMUSCULAR | Status: DC | PRN
  Administered 2013-10-08: 5 mg via INTRAVENOUS

## 2013-10-08 MED ORDER — CALCIUM CARBONATE 1250 (500 CA) MG PO TABS
2.0000 | ORAL_TABLET | Freq: Three times a day (TID) | ORAL | Status: DC
  Administered 2013-10-08 – 2013-10-10 (×6): 1000 mg via ORAL
  Filled 2013-10-08 (×8): qty 2

## 2013-10-08 MED ORDER — KCL IN DEXTROSE-NACL 20-5-0.45 MEQ/L-%-% IV SOLN
INTRAVENOUS | Status: DC
  Administered 2013-10-08: 17:00:00 via INTRAVENOUS
  Filled 2013-10-08 (×2): qty 1000

## 2013-10-08 MED ORDER — ONDANSETRON HCL 4 MG/2ML IJ SOLN: 4.0000 mg | Freq: Four times a day (QID) | INTRAMUSCULAR | Status: DC | PRN

## 2013-10-08 SURGICAL SUPPLY — 38 items
ATTRACTOMAT 16X20 MAGNETIC DRP (DRAPES) ×2 IMPLANT
BENZOIN TINCTURE PRP APPL 2/3 (GAUZE/BANDAGES/DRESSINGS) ×2 IMPLANT
BLADE HEX COATED 2.75 (ELECTRODE) ×2 IMPLANT
BLADE SURG 15 STRL LF DISP TIS (BLADE) ×1 IMPLANT
BLADE SURG 15 STRL SS (BLADE) ×1
CANISTER SUCTION 2500CC (MISCELLANEOUS) ×2 IMPLANT
CHLORAPREP W/TINT 26ML (MISCELLANEOUS) ×2 IMPLANT
CLIP TI MEDIUM 6 (CLIP) ×14 IMPLANT
CLIP TI WIDE RED SMALL 6 (CLIP) ×10 IMPLANT
DISSECTOR ROUND CHERRY 3/8 STR (MISCELLANEOUS) ×2 IMPLANT
DRAPE PED LAPAROTOMY (DRAPES) ×2 IMPLANT
DRESSING SURGICEL FIBRLLR 1X2 (HEMOSTASIS) ×1 IMPLANT
DRSG SURGICEL FIBRILLAR 1X2 (HEMOSTASIS) ×2
ELECT REM PT RETURN 9FT ADLT (ELECTROSURGICAL) ×2
ELECTRODE REM PT RTRN 9FT ADLT (ELECTROSURGICAL) ×1 IMPLANT
GAUZE SPONGE 4X4 16PLY XRAY LF (GAUZE/BANDAGES/DRESSINGS) ×2 IMPLANT
GLOVE SURG ORTHO 8.0 STRL STRW (GLOVE) ×2 IMPLANT
GOWN STRL REIN XL XLG (GOWN DISPOSABLE) ×6 IMPLANT
HOVERMATT SINGLE USE (MISCELLANEOUS) ×2 IMPLANT
KIT BASIN OR (CUSTOM PROCEDURE TRAY) ×2 IMPLANT
NS IRRIG 1000ML POUR BTL (IV SOLUTION) ×2 IMPLANT
PACK BASIC VI WITH GOWN DISP (CUSTOM PROCEDURE TRAY) ×2 IMPLANT
PENCIL BUTTON HOLSTER BLD 10FT (ELECTRODE) ×2 IMPLANT
SHEARS HARMONIC 9CM CVD (BLADE) ×2 IMPLANT
SPONGE GAUZE 4X4 12PLY (GAUZE/BANDAGES/DRESSINGS) ×2 IMPLANT
STAPLER VISISTAT 35W (STAPLE) ×2 IMPLANT
STRIP CLOSURE SKIN 1/2X4 (GAUZE/BANDAGES/DRESSINGS) ×2 IMPLANT
SUT MNCRL AB 4-0 PS2 18 (SUTURE) ×2 IMPLANT
SUT SILK 2 0 (SUTURE) ×1
SUT SILK 2-0 18XBRD TIE 12 (SUTURE) ×1 IMPLANT
SUT SILK 3 0 (SUTURE)
SUT SILK 3-0 18XBRD TIE 12 (SUTURE) IMPLANT
SUT VIC AB 3-0 SH 18 (SUTURE) ×4 IMPLANT
SYR BULB IRRIGATION 50ML (SYRINGE) ×2 IMPLANT
TAPE CLOTH SURG 4X10 WHT LF (GAUZE/BANDAGES/DRESSINGS) ×2 IMPLANT
TOWEL OR 17X26 10 PK STRL BLUE (TOWEL DISPOSABLE) ×2 IMPLANT
TOWEL OR NON WOVEN STRL DISP B (DISPOSABLE) ×2 IMPLANT
YANKAUER SUCT BULB TIP 10FT TU (MISCELLANEOUS) ×2 IMPLANT

## 2013-10-08 NOTE — Anesthesia Postprocedure Evaluation (Signed)
  Anesthesia Post-op Note  Patient: Diamond [name]  Procedure(s) Performed: Procedure(s) (LRB): TOTAL THYROIDECTOMY (N/A)  Patient Location: PACU  Anesthesia Type: General  Level of Consciousness: awake and alert   Airway and Oxygen Therapy: Patient Spontanous Breathing  Post-op Pain: mild  Post-op Assessment: Post-op Vital signs reviewed, Patient's Cardiovascular Status Stable, Respiratory Function Stable, Patent Airway and No signs of Nausea or vomiting  Last Vitals:  Filed Vitals:   10/08/13 1254  BP: 123/67  Pulse: 75  Temp: 36.5 C  Resp: 16    Post-op Vital Signs: stable   Complications: No apparent anesthesia complications

## 2013-10-08 NOTE — Interval H&P Note (Signed)
History and Physical Interval Note:  10/08/2013 8:36 AM  Diamond [name]  has presented today for surgery, with the diagnosis of multinodular goiter with compressive symptoms.  The various methods of treatment have been discussed with the patient and family. After consideration of risks, benefits and other options for treatment, the patient has consented to    Procedure(s): TOTAL THYROIDECTOMY (N/A) as a surgical intervention .    The patient's history has been reviewed, patient examined, no change in status, stable for surgery.  I have reviewed the patient's chart and labs.  Questions were answered to the patient's satisfaction.    Velora Heckler, MD, FACS General & Endocrine Surgery [facility] Office: [phone]  Diamond [name]

## 2013-10-08 NOTE — H&P (View-Only) (Signed)
General Surgery - [facility]  Chief Complaint  Patient presents with  . New Evaluation    eval multinodular goiter - referral from Dr. Cristina Gherghe    HISTORY: The patient is a 27-year-old female referred by her endocrinologist for multinodular thyroid goiter with compressive symptoms. Patient was initially diagnosed in [place] approximately 2 years ago. She has multiple thyroid nodules. By report she is undergone fine-needle aspiration biopsy which showed benign cytopathology. Patient was evaluated by her endocrinologist. She has multiple complaints of compressive symptoms including dysphagia with both solids and liquids, dyspnea when in a recumbent position, and mild discomfort. Patient underwent a barium swallow on 08/27/2013. This shows a slight impression in the cervical esophagus. A 13 mm barium tablet did not pass through this region easily and required sips of water in order for the tablet to pass. This process did reproduce the patient's symptoms of dysphagia. Patient is now referred for consideration for total thyroidectomy for multinodular thyroid goiter with compressive symptoms.  Patient has had no prior history of head or neck surgery. She has never been on thyroid medication. She is accompanied by her mother who underwent total thyroidectomy a little over one year ago for multinodular thyroid goiter. Her procedure was performed in [place]. There is no family history of thyroid cancer. There is no family history of other endocrine neoplasm.  Past Medical History  Diagnosis Date  . History of chicken pox   . Multiple thyroid nodules 04/2013    3 nodules per pt  . Anemia     Current Outpatient Prescriptions  Medication Sig Dispense Refill  . ferrous sulfate 325 (65 FE) MG tablet Take 325 mg by mouth 2 (two) times daily.       No current facility-administered medications for this visit.    Allergies  Allergen Reactions  . Penicillins Rash    Family  History  Problem Relation Age of Onset  . Thyroid disease Mother     " had thyroid removed due to nodules per pt"  . Hyperlipidemia Father   . Hypertension Father   . Diabetes Father   . Cancer Paternal Grandmother     lung  . Hyperlipidemia Paternal Grandmother   . Hypertension Paternal Grandmother   . Diabetes Paternal Grandmother     History   Social History  . Marital Status: Single    Spouse Name: N/A    Number of Children: N/A  . Years of Education: N/A   Social History Main Topics  . Smoking status: Never Smoker   . Smokeless tobacco: Never Used  . Alcohol Use: 1.0 oz/week    2 drink(s) per week     Comment: occasional  . Drug Use: No  . Sexual Activity: Yes    Partners: Male   Other Topics Concern  . 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 Denies palpitations. Moderate dysphagia. Mild dyspnea. Complains of occasional hoarseness and raspiness of voice.  EXAM: Filed   Vitals:   09/10/13 1547  BP: 140/90  Pulse: 72  Temp: 98 F (36.7 C)  Resp: 15    HEENT: normocephalic; pupils equal and reactive; sclerae clear; dentition good; mucous membranes moist NECK:  Anterior neck is full with an enlarged soft multinodular gland; left thyroid lobe is most prominent with a palpable 3 cm nodule in the anterior or left lobe; asymmetric on extension; no palpable anterior or posterior cervical lymphadenopathy; no supraclavicular masses; no tenderness CHEST: clear to auscultation bilaterally without rales, rhonchi, or wheezes CARDIAC: regular rate and rhythm without significant murmur;  peripheral pulses are full EXT:  non-tender without edema; no deformity NEURO: no gross focal deficits; no sign of tremor   LABORATORY RESULTS: See [facility] (CHL-Epic) for most recent results  RADIOLOGY RESULTS: See [facility] (CHL-Epic) for most recent results  IMPRESSION: Multinodular thyroid goiter with compressive symptoms  PLAN: I discussed the above findings at length with the patient and her mother. We reviewed her barium swallow study results. I have recommended total thyroidectomy for management of multinodular goiter with compressive symptoms. We have discussed the risk and benefits of the procedure including the potential for injury to recurrent laryngeal nerves and parathyroid glands. We have discussed the hospital stay to be anticipated. We have discussed her recovery in time out of work. Patient understands and wishes to proceed with surgery in the near future. We will schedule her procedure at a time convenient for the patient.  The risks and benefits of the procedure have been discussed at length with the patient.  The patient understands the proposed procedure, potential alternative treatments, and the course of recovery to be expected.  All of the patient's questions have been answered at this time.  The patient wishes to proceed with surgery.  Jatorian Renault M. Caran Storck, MD, FACS General & Endocrine Surgery [facility]  Primary Care Physician: O'SULLIVAN,MELISSA S., NP   

## 2013-10-08 NOTE — Anesthesia Preprocedure Evaluation (Addendum)
Anesthesia Evaluation  Patient identified by MRN, date of birth, ID band Patient awake    Reviewed: Allergy & Precautions, H&P , NPO status , Patient's Chart, lab work & pertinent test results  Airway Mallampati: II TM Distance: >3 FB Neck ROM: full    Dental no notable dental hx. (+) Teeth Intact and Dental Advisory Given   Pulmonary neg pulmonary ROS,  breath sounds clear to auscultation  Pulmonary exam normal       Cardiovascular Exercise Tolerance: Good negative cardio ROS  Rhythm:regular Rate:Normal     Neuro/Psych negative neurological ROS  negative psych ROS   GI/Hepatic negative GI ROS, Neg liver ROS,   Endo/Other  Hyperthyroidism Morbid obesity  Renal/GU negative Renal ROS  negative genitourinary   Musculoskeletal   Abdominal (+) + obese,   Peds  Hematology negative hematology ROS (+) anemia , hgb 9.9   Anesthesia Other Findings   Reproductive/Obstetrics negative OB ROS                          Anesthesia Physical Anesthesia Plan  ASA: III  Anesthesia Plan: General   Post-op Pain Management:    Induction: Intravenous  Airway Management Planned: Oral ETT  Additional Equipment:   Intra-op Plan:   Post-operative Plan: Extubation in OR  Informed Consent: I have reviewed the patients History and Physical, chart, labs and discussed the procedure including the risks, benefits and alternatives for the proposed anesthesia with the patient or authorized representative who has indicated his/her understanding and acceptance.   Dental Advisory Given  Plan Discussed with: CRNA and Surgeon  Anesthesia Plan Comments:        Anesthesia Quick Evaluation

## 2013-10-08 NOTE — Brief Op Note (Signed)
10/08/2013  11:48 AM  PATIENT:  Diamond [name]  27 y.o. female  PRE-OPERATIVE DIAGNOSIS:  multinodular goiter with compressive symptoms  POST-OPERATIVE DIAGNOSIS:  same  PROCEDURE:  Procedure(s): TOTAL THYROIDECTOMY (N/A)  SURGEON:  Surgeon(s) and Role:    * Velora Heckler, MD - Primary  ANESTHESIA:   general  EBL:  Total I/O In: 1000 [I.V.:1000] Out: 70 [Blood:70]  BLOOD ADMINISTERED:none  DRAINS: none   LOCAL MEDICATIONS USED:  NONE  SPECIMEN:  Excision  DISPOSITION OF SPECIMEN:  PATHOLOGY  COUNTS:  YES  TOURNIQUET:  * No tourniquets in log *  DICTATION: .Other Dictation: Dictation Number [id number]  PLAN OF CARE: Admit for overnight observation  PATIENT DISPOSITION:  PACU - hemodynamically stable.   Delay start of Pharmacological VTE agent (>24hrs) due to surgical blood loss or risk of bleeding: yes  Velora Heckler, MD, [facility] Office: [phone]

## 2013-10-08 NOTE — Transfer of Care (Signed)
Immediate Anesthesia Transfer of Care Note  Patient: Diamond [name]  Procedure(s) Performed: Procedure(s): TOTAL THYROIDECTOMY (N/A)  Patient Location: PACU  Anesthesia Type:General  Level of Consciousness: awake, alert , oriented and patient cooperative  Airway & Oxygen Therapy: Patient Spontanous Breathing and Patient connected to face mask oxygen  Post-op Assessment: Report given to PACU RN, Post -op Vital signs reviewed and stable and Patient moving all extremities  Post vital signs: Reviewed and stable  Complications: No apparent anesthesia complications

## 2013-10-09 LAB — BASIC METABOLIC PANEL
BUN: 8 mg/dL (ref 6–23)
Calcium: 8.9 mg/dL (ref 8.4–10.5)
Creatinine, Ser: 0.6 mg/dL (ref 0.50–1.10)
GFR calc Af Amer: >90 mL/min (ref >90)
GFR calc non Af Amer: >90 mL/min (ref >90)

## 2013-10-09 NOTE — Progress Notes (Signed)
Utilization Review completed.  

## 2013-10-09 NOTE — Progress Notes (Signed)
General Surgery Note  LOS: 1 day  POD -  1 Day Post-Op  Assessment/Plan: 1.  TOTAL THYROIDECTOMY - 10/08/2013 - T. Gerkin  Ca++ - 8.9 - 10/09/2013  Taking liquids okay.  Will keep here one more day till voiding on her own.   2.  DVT prophylaxis - PAS 3.  Urinary retention  Last cath 0600 - 800 cc on cath   Principal Problem:   Multinodular goiter  Subjective:  Taking liquids okay, but throat is sore.  Sister is in room.  Objective:   Filed Vitals:   10/09/13 0530  BP: 137/78  Pulse: 104  Temp: 98.2 F (36.8 C)  Resp: 18     Intake/Output from previous day:  12/05 0701 - 12/06 0700 In: 2452.9 [I.V.:2452.9] Out: 1855 [Urine:1775; Blood:80]  Intake/Output this shift:      Physical Exam:   General: Large AA F who is alert and oriented.    HEENT: Normal. Pupils equal. .   Lungs: clear.   Neck Wound: Dressing intact.  I have left the dressing since the patient is going to stay.  Will change tomorrow.   Lab Results:   No results found for this basename: WBC, HGB, HCT, PLT,  in the last 72 hours  BMET   Recent Labs  10/09/13 0545  NA 136  K 3.3*  CL 102  CO2 22  GLUCOSE 126*  BUN 8  CREATININE 0.60  CALCIUM 8.9    PT/INR  No results found for this basename: LABPROT, INR,  in the last 72 hours  ABG  No results found for this basename: PHART, PCO2, PO2, HCO3,  in the last 72 hours   Studies/Results:  No results found.   Anti-infectives:   Anti-infectives   Start     Dose/Rate Route Frequency Ordered Stop   10/08/13 0728  ciprofloxacin (CIPRO) IVPB 400 mg     400 mg 200 mL/hr over 60 Minutes Intravenous On call to O.R. 10/08/13 0728 10/08/13 0906      Ovidio Kin, MD, FACS Pager: [phone] [facility] Office: [phone] 10/09/2013

## 2013-10-09 NOTE — Progress Notes (Signed)
Pt unable to urinate. Paged MD on call. New orders received. Will continue to monitor.

## 2013-10-09 NOTE — Op Note (Signed)
NAMESHANDI, GODFREY         ACCOUNT NO.:  [id number]  MEDICAL RECORD NO.:  [id number]  LOCATION:  1536                         FACILITY:  [facility]  PHYSICIAN:  Velora Heckler, MD      DATE OF BIRTH:  Jun 22, 1986  DATE OF PROCEDURE:  10/08/2013                               OPERATIVE REPORT   PREOPERATIVE DIAGNOSIS:  Multinodular thyroid goiter with compressive symptoms.  POSTOPERATIVE DIAGNOSIS:  Multinodular thyroid goiter with compressive symptoms.  PROCEDURE:  Total thyroidectomy.  SURGEON:  Velora Heckler, MD, FACS  ANESTHESIA:  General.  ESTIMATED BLOOD LOSS:  Minimal.  PREPARATION:  ChloraPrep.  COMPLICATIONS:  None.  INDICATIONS:  The patient is a 27 year old female referred by her endocrinologist with multinodular goiter with compressive symptoms.  The patient had been diagnosed in [place] 2 years ago.  She had undergone fine needle aspiration biopsy showing benign cytopathology.  She has developed compressive symptoms including dysphagia, dyspnea, and mild discomfort.  Barium swallow shows an impression on the cervical esophagus.  The patient now comes to Surgery for thyroidectomy.  BODY OF REPORT:  Procedures done in OR #1 at the [facility].  The patient was brought to the operating room, placed in supine position on the operating room table.  Following administration of general anesthesia, the patient was positioned and then prepped and draped in the usual strict aseptic fashion.  After ascertaining that an adequate level of anesthesia had been achieved, a cervical incision was made with a #15 blade.  Dissection was carried through subcutaneous tissues and platysma.  Hemostasis was achieved with electrocautery. Skin flaps were elevated cephalad and caudad from the thyroid notch to the sternal notch.  A Mahorner self-retaining retractor was placed for exposure.  Strap muscles were incised in the midline.  Thyroid gland was markedly  enlarged.  Left lobe was larger than the right.  Strap muscles were reflected to the left exposing the enlarged left thyroid lobe. Left lobe was gently mobilized with blunt dissection.  Middle thyroid veins were divided between Ligaclips with the Harmonic scalpel. Superior pole was dissected out and superior pole vessels were divided individually between small and medium Ligaclips with the Harmonic scalpel.  Superior parathyroid gland was identified and preserved. Inferiorly, the left lobe was markedly enlarged and extends beneath the clavicle.  It was gently mobilized.  There were small adjacent 1-2 cm nodules of thyroid tissue which extend into the mediastinum down to the level of the aortic arch.  These were gently dissected out and mobilized.  Branches of the inferior thyroid artery were divided between small and medium Ligaclips with the Harmonic scalpel.  Gland was rolled anteriorly.  Recurrent laryngeal nerve was identified and preserved along its length.  Inferior parathyroid gland was identified and preserved on its vascular pedicle.  Gland was rolled anteriorly and on to the anterior trachea.  The ligament of Allyson Sabal was released with the electrocautery and the gland was mobilized across the midline.  Isthmus was mobilized off the trachea.  There was a moderate-sized pyramidal lobe, which was dissected off the anterior thyroid cartilage.  It was resected with the isthmus.  Dry pack was placed in the left neck.  Next, we turned  our attention to the right side.  Again, strap muscles were reflected laterally.  Right lobe was enlarged.  It extends well cephalad with a large superior pole.  The pole was dissected out and vessels were divided individually between small and medium Ligaclips with the Harmonic scalpel.  Superior parathyroid gland was identified on the right and preserved on its vascular pedicle.  Branches of the inferior thyroid artery were divided between small and medium  Ligaclips with the Harmonic scalpel.  There was approximately a 2-cm nodular mass which was somewhat separate from the thyroid tissue.  It was mobilized and resected with the thyroid.  Ligament of Allyson Sabal was released. Recurrent laryngeal nerve was identified and preserved.  Branches of the inferior thyroid artery were divided between small and medium Ligaclips with the Harmonic scalpel.  Inferior venous tributaries were divided between Ligaclips with the Harmonic scalpel.  Gland was rolled on to the airway and excised off the anterior airway using the electrocautery. Trachea was moderately deviated to the right due to the mass in the left lower pole.  The entire thyroid gland was excised.  A suture was used to mark the left superior pole.  The entire gland was submitted to Pathology for review.  The neck was irrigated with warm saline.  The neck was palpated.  There was no lymphadenopathy.  There were no further thyroid nodules to be identified.  Good hemostasis was achieved.  Fibrillar was placed throughout the operative field.  Strap muscles were reapproximated in the midline with interrupted 3-0 Vicryl sutures.  Platysma was closed with interrupted 3-0 Vicryl sutures.  Skin was closed with a running 4-0 Monocryl subcuticular suture.  Wound was washed and dried, and benzoin and Steri-Strips were applied.  Sterile dressings were applied.  The patient was awakened from anesthesia and brought to the recovery room. The patient tolerated the procedure well.   Velora Heckler, MD, [facility] Office: [phone]    TMG/MEDQ  D:  10/08/2013  T:  10/09/2013  Job:  [id number]  cc:   Carlus Pavlov, M.D. Fax: [phone]  Sandford Craze, NP Fax: [phone]

## 2013-10-10 LAB — BASIC METABOLIC PANEL
Calcium: 9.3 mg/dL (ref 8.4–10.5)
Creatinine, Ser: 0.56 mg/dL (ref 0.50–1.10)
GFR calc non Af Amer: >90 mL/min (ref >90)
Glucose, Bld: 113 mg/dL — ABNORMAL HIGH (ref 70–99)
Sodium: 135 mEq/L (ref 135–145)

## 2013-10-10 MED ORDER — CALCIUM CARBONATE 1250 (500 CA) MG PO TABS: 2.0000 | ORAL_TABLET | Freq: Two times a day (BID) | ORAL | Status: DC

## 2013-10-10 MED ORDER — HYDROCODONE-ACETAMINOPHEN 5-325 MG PO TABS: 1.0000 | ORAL_TABLET | Freq: Four times a day (QID) | ORAL | Status: DC | PRN

## 2013-10-10 MED ORDER — LEVOTHYROXINE SODIUM 100 MCG PO TABS: 100.0000 ug | ORAL_TABLET | Freq: Every day | ORAL | Status: DC

## 2013-10-10 NOTE — Discharge Summary (Signed)
Physician Discharge Summary  Patient ID:  Diamond [name]  MRN: [id number]  DOB/AGE: [birth year] 27 y.o.  Admit date: 10/08/2013 Discharge date: 10/10/2013  Discharge Diagnoses:   Principal Problem:   Multinodular goiter 2.  Post op urinary retention 3.  Morbid obesity  Operation: Procedure(s): TOTAL THYROIDECTOMY on 10/08/2013 - T. Gerkin  Discharged Condition: good  Hospital Course: Diamond [name] is an 27 y.o. female whose primary care physician is Lemont Fillers., NP and who was admitted 10/08/2013 with a chief complaint of thyroid goiter/mass.   She was brought to the operating room on 10/08/2013 and underwent  TOTAL THYROIDECTOMY.   She developed post op urinary retention requiring I&O catheterization.  Her post op Ca++ was 8.9.   She is now 2 days post op, voiding well, tolerating diet.  She is ready to go home.  Her sister is in the room with her.  The discharge instructions were reviewed with the patient.  Consults: None  Significant Diagnostic Studies: Results for orders placed during the hospital encounter of 10/08/13  BASIC METABOLIC PANEL      Result Value Range   Sodium 136  135 - 145 mEq/L   Potassium 3.3 (*) 3.5 - 5.1 mEq/L   Chloride 102  96 - 112 mEq/L   CO2 22  19 - 32 mEq/L   Glucose, Bld 126 (*) 70 - 99 mg/dL   BUN 8  6 - 23 mg/dL   Creatinine, Ser 4.09  0.50 - 1.10 mg/dL   Calcium 8.9  8.4 - 81.1 mg/dL   GFR calc non Af Amer >90  >90 mL/min   GFR calc Af Amer >90  >90 mL/min  BASIC METABOLIC PANEL      Result Value Range   Sodium 135  135 - 145 mEq/L   Potassium 3.3 (*) 3.5 - 5.1 mEq/L   Chloride 100  96 - 112 mEq/L   CO2 24  19 - 32 mEq/L   Glucose, Bld 113 (*) 70 - 99 mg/dL   BUN 7  6 - 23 mg/dL   Creatinine, Ser 9.14  0.50 - 1.10 mg/dL   Calcium 9.3  8.4 - 78.2 mg/dL   GFR calc non Af Amer >90  >90 mL/min   GFR calc Af Amer >90  >90 mL/min    Dg Chest 2 View  09/27/2013   CLINICAL DATA:  Preop for thyroidectomy.  No chest  complaints.  EXAM: CHEST  2 VIEW  COMPARISON:  None.  FINDINGS: The heart size and mediastinal contours are within normal limits. Both lungs are clear. The visualized skeletal structures are unremarkable.  IMPRESSION: No active cardiopulmonary disease.   Electronically Signed   By: Amie Portland M.D.   On: 09/27/2013 12:52    Discharge Exam:  Filed Vitals:   10/10/13 0537  BP: 127/80  Pulse: 94  Temp: 99.2 F (37.3 C)  Resp: 18    General: Obese AA F who is alert and generally healthy appearing.  Neck:  Wound looks good. Will leave dressing off. Lungs: Clear to auscultation and symmetric breath sounds. Heart:  RRR. No murmur or rub.  Discharge Medications:     Medication List         calcium carbonate 1250 MG tablet  Commonly known as:  OS-CAL - dosed in mg of elemental calcium  Take 2 tablets (1,000 mg of elemental calcium total) by mouth 2 (two) times daily with a meal.     ferrous sulfate 325 (65 FE) MG tablet  Take 325 mg by mouth 2 (two) times daily.     HYDROcodone-acetaminophen 5-325 MG per tablet  Commonly known as:  NORCO/VICODIN  Take 1-2 tablets by mouth every 6 (six) hours as needed for moderate pain.     levothyroxine 100 MCG tablet  Commonly known as:  SYNTHROID, LEVOTHROID  Take 1 tablet (100 mcg total) by mouth daily before breakfast.        Disposition: Final discharge disposition not confirmed      Discharge Orders   Future Appointments Provider Department Dept Phone   11/03/2013 11:00 AM Velora Heckler, MD [facility], [place] [phone]   11/09/2013 11:00 AM Carlus Pavlov, MD [facility] [phone]   02/18/2014 11:15 AM Bradd Canary, MD [facility] [phone]   Future Orders Complete By Expires   Diet - low sodium heart healthy  As directed    Increase activity slowly  As directed       Follow-up Information   Follow up with Velora Heckler, MD. Schedule an appointment as soon as  possible for a visit in 3 weeks.   Specialty:  General Surgery   Contact information:   [address] [place] [address] [phone]      [facility] - DISCHARGE INSTRUCTIONS TO PATIENT  Return to work on:  2 weeks  Activity:  Driving - May drive in 3 or 4 days, if doing well   Lifting - No limit  Wound Care:   May shower  Diet:  As tolerated  Follow up appointment:  Call Dr. Ardine Eng office [facility]) at [phone] for an appointment in 2 to 3 weeks.  Medications and dosages:  Resume your home medications.  You have a prescription for:  Calcium, thyroid med, Vicodin  Signed: Ovidio Kin, M.D., [facility] Office:  [phone]  10/10/2013, 8:15 AM

## 2013-10-11 ENCOUNTER — Emergency Department (HOSPITAL_COMMUNITY)
Admission: EM | Admit: 2013-10-11 | Discharge: 2013-10-11 | Disposition: A | Discharge status: Home / Self Care | Payer: BC Managed Care – PPO | Attending: Emergency Medicine | Admitting: Emergency Medicine

## 2013-10-11 ENCOUNTER — Telehealth (INDEPENDENT_AMBULATORY_CARE_PROVIDER_SITE_OTHER): Payer: Self-pay | Admitting: *Deleted

## 2013-10-11 ENCOUNTER — Encounter (HOSPITAL_COMMUNITY): Payer: Self-pay | Admitting: Emergency Medicine

## 2013-10-11 ENCOUNTER — Telehealth (INDEPENDENT_AMBULATORY_CARE_PROVIDER_SITE_OTHER): Payer: Self-pay | Admitting: General Surgery

## 2013-10-11 DIAGNOSIS — J029 Acute pharyngitis, unspecified: Secondary | ICD-10-CM | POA: Insufficient documentation

## 2013-10-11 DIAGNOSIS — Z8639 Personal history of other endocrine, nutritional and metabolic disease: Secondary | ICD-10-CM | POA: Insufficient documentation

## 2013-10-11 DIAGNOSIS — R21 Rash and other nonspecific skin eruption: Secondary | ICD-10-CM | POA: Insufficient documentation

## 2013-10-11 DIAGNOSIS — I10 Essential (primary) hypertension: Secondary | ICD-10-CM | POA: Insufficient documentation

## 2013-10-11 DIAGNOSIS — Z79899 Other long term (current) drug therapy: Secondary | ICD-10-CM | POA: Insufficient documentation

## 2013-10-11 DIAGNOSIS — T7840XA Allergy, unspecified, initial encounter: Secondary | ICD-10-CM

## 2013-10-11 DIAGNOSIS — Z8619 Personal history of other infectious and parasitic diseases: Secondary | ICD-10-CM | POA: Insufficient documentation

## 2013-10-11 DIAGNOSIS — Z862 Personal history of diseases of the blood and blood-forming organs and certain disorders involving the immune mechanism: Secondary | ICD-10-CM | POA: Insufficient documentation

## 2013-10-11 DIAGNOSIS — R1310 Dysphagia, unspecified: Secondary | ICD-10-CM | POA: Insufficient documentation

## 2013-10-11 DIAGNOSIS — Z9889 Other specified postprocedural states: Secondary | ICD-10-CM | POA: Insufficient documentation

## 2013-10-11 DIAGNOSIS — Z9089 Acquired absence of other organs: Secondary | ICD-10-CM | POA: Insufficient documentation

## 2013-10-11 DIAGNOSIS — R220 Localized swelling, mass and lump, head: Secondary | ICD-10-CM | POA: Insufficient documentation

## 2013-10-11 DIAGNOSIS — Z8701 Personal history of pneumonia (recurrent): Secondary | ICD-10-CM | POA: Insufficient documentation

## 2013-10-11 DIAGNOSIS — R498 Other voice and resonance disorders: Secondary | ICD-10-CM | POA: Insufficient documentation

## 2013-10-11 DIAGNOSIS — E669 Obesity, unspecified: Secondary | ICD-10-CM | POA: Insufficient documentation

## 2013-10-11 DIAGNOSIS — Z880 Allergy status to penicillin: Secondary | ICD-10-CM | POA: Insufficient documentation

## 2013-10-11 LAB — POCT I-STAT, CHEM 8
BUN: 4 mg/dL — ABNORMAL LOW (ref 6–23)
Calcium, Ion: 1.24 mmol/L — ABNORMAL HIGH (ref 1.12–1.23)
Chloride: 101 mEq/L (ref 96–112)
Glucose, Bld: 107 mg/dL — ABNORMAL HIGH (ref 70–99)
HCT: 32 % — ABNORMAL LOW (ref 36.0–46.0)
Hemoglobin: 10.9 g/dL — ABNORMAL LOW (ref 12.0–15.0)
Sodium: 142 mEq/L (ref 135–145)
TCO2: 25 mmol/L (ref 0–100)

## 2013-10-11 LAB — CBC
MCH: 24 pg — ABNORMAL LOW (ref 26.0–34.0)
MCHC: 31.2 g/dL (ref 30.0–36.0)
MCV: 76.8 fL — ABNORMAL LOW (ref 78.0–100.0)
Platelets: 338 10*3/uL (ref 150–400)
RBC: 3.88 MIL/uL (ref 3.87–5.11)
RDW: 16.5 % — ABNORMAL HIGH (ref 11.5–15.5)
WBC: 12 10*3/uL — ABNORMAL HIGH (ref 4.0–10.5)

## 2013-10-11 LAB — CALCIUM: Calcium: 9.6 mg/dL (ref 8.4–10.5)

## 2013-10-11 MED ORDER — METHYLPREDNISOLONE SODIUM SUCC 125 MG IJ SOLR
125.0000 mg | Freq: Once | INTRAMUSCULAR | Status: AC
  Administered 2013-10-11: 125 mg via INTRAVENOUS
  Filled 2013-10-11: qty 2

## 2013-10-11 MED ORDER — PREDNISONE 20 MG PO TABS: 40.0000 mg | ORAL_TABLET | Freq: Every day | ORAL | Status: DC

## 2013-10-11 MED ORDER — DIPHENHYDRAMINE HCL 50 MG/ML IJ SOLN
25.0000 mg | Freq: Once | INTRAMUSCULAR | Status: AC
  Administered 2013-10-11: 25 mg via INTRAVENOUS
  Filled 2013-10-11: qty 1

## 2013-10-11 MED ORDER — FAMOTIDINE IN NACL 20-0.9 MG/50ML-% IV SOLN
20.0000 mg | Freq: Once | INTRAVENOUS | Status: AC
  Administered 2013-10-11: 20 mg via INTRAVENOUS
  Filled 2013-10-11: qty 50

## 2013-10-11 MED ORDER — EPINEPHRINE 0.3 MG/0.3ML IJ SOAJ
0.3000 mg | Freq: Once | INTRAMUSCULAR | Status: AC
  Administered 2013-10-11: 0.3 mg via INTRAMUSCULAR
  Filled 2013-10-11: qty 0.3

## 2013-10-11 MED ORDER — DIPHENHYDRAMINE HCL 25 MG PO CAPS: 25.0000 mg | ORAL_CAPSULE | Freq: Four times a day (QID) | ORAL | Status: DC | PRN

## 2013-10-11 NOTE — ED Notes (Signed)
Notified EDP, Opitz,MD. Pt. i-stat Chem 8 results potassium 3.0.

## 2013-10-11 NOTE — Telephone Encounter (Signed)
Patient called to report that she had a total thyroidectomy on 10/08/13 then last night began having swelling in her lower lip, she felt like in her throat, and a rash.  Patient was seen in the ED last night into this morning.  Patient was given steroids, Benadryl however it is stated to be questionable this is an allergic reaction.  Patient states she is unsure what to take including her thyroid medication and wants to know what Dr. Gerrit Friends advises.  Explained that I will send a message to Dr. Gerrit Friends to ask him to review the ED notes and determine what the best plan is for the patient then we will let her know.  Patient states understanding and agreeable at this time.

## 2013-10-11 NOTE — Telephone Encounter (Signed)
Patient called back asking what medications to take. She is having tingling in hands and face. She has not taken any medications she was discharged with. She wants to take her calcium for the tingling. Per Dr Ezzard Standing note at discharge says more likely post operative symptoms and not an allergic reaction. I let patient know I thought it would be okay for her to take her calcium but would send another note to Dr Gerrit Friends and get instructions on what medications she should take. Dr Gerrit Friends paged by Baxter Hire. We will call her back once clarification is given.

## 2013-10-11 NOTE — ED Notes (Signed)
Pt states "doesn't feel hard to breathe any more but throat still feels a little tight."

## 2013-10-11 NOTE — ED Provider Notes (Signed)
CSN: [id number]     Arrival date & time 10/11/13  0010 History   First MD Initiated Contact with Patient 10/11/13 0014     Chief Complaint  Patient presents with  . Allergic Reaction   (Consider location/radiation/quality/duration/timing/severity/associated sxs/prior Treatment) HPI History per patient. Thyroidectomy in 2 days ago, discharged home today. After going home developed increased swelling in her throat with a scratchy voice, swelling to her lower lip and itchy rash to her chin. She had taken pain pills in the hospital prior to going home and after going home took calcium as prescribed. No other medications. She had some clear liquids but no other foods. Known allergy to penicillin but no other known drug or food allergies. Symptoms moderate in severity. She felt like she had trouble breathing at home but this is feeling better currently. She still has sensation of swelling in her throat. No wheezes. No history of same.      Past Medical History  Diagnosis Date  . History of chicken pox   . Multiple thyroid nodules 04/2013    3 nodules per pt  . Anemia   . Pneumonia     hx of as child   Past Surgical History  Procedure Laterality Date  . Appendectomy      "teenager"  . Tonsillectomy and adenoidectomy  2012  . Fracture surgery Right     "compound fracture of leg per pt"  . Hardware removal      a month after fracture surgery   Family History  Problem Relation Age of Onset  . Thyroid disease Mother     " had thyroid removed due to nodules per pt"  . Hyperlipidemia Father   . Hypertension Father   . Diabetes Father   . Cancer Paternal Grandmother     lung  . Hyperlipidemia Paternal Grandmother   . Hypertension Paternal Grandmother   . Diabetes Paternal Grandmother    History  Substance Use Topics  . Smoking status: Never Smoker   . Smokeless tobacco: Never Used  . Alcohol Use: 0.0 oz/week     Comment: occasional   OB History   Grav Para Term Preterm  Abortions TAB SAB Ect Mult Living                 Review of Systems  Constitutional: Negative for fever and chills.  HENT: Positive for sore throat and trouble swallowing.   Respiratory: Negative for shortness of breath.   Cardiovascular: Negative for chest pain.  Gastrointestinal: Negative for vomiting and abdominal pain.  Genitourinary: Negative for dysuria.  Musculoskeletal: Negative for back pain, neck pain and neck stiffness.  Skin: Positive for rash.  Neurological: Negative for headaches.  All other systems reviewed and are negative.    Allergies  Penicillins  Home Medications   Current Outpatient Rx  Name  Route  Sig  Dispense  Refill  . calcium carbonate (OS-CAL - DOSED IN MG OF ELEMENTAL CALCIUM) 1250 MG tablet   Oral   Take 2 tablets (1,000 mg of elemental calcium total) by mouth 2 (two) times daily with a meal.   60 tablet   1   . ferrous sulfate 325 (65 FE) MG tablet   Oral   Take 325 mg by mouth 2 (two) times daily.         Marland Kitchen HYDROcodone-acetaminophen (NORCO/VICODIN) 5-325 MG per tablet   Oral   Take 1-2 tablets by mouth every 6 (six) hours as needed for moderate pain.   30  tablet   0   . levothyroxine (SYNTHROID, LEVOTHROID) 100 MCG tablet   Oral   Take 1 tablet (100 mcg total) by mouth daily before breakfast.   1 tablet   3    BP 131/84  Pulse 89  Temp(Src) 99 F (37.2 C) (Oral)  Resp 15  Ht 5\' 8"  (1.727 m)  Wt 303 lb (137.44 kg)  BMI 46.08 kg/m2  SpO2 99%  LMP 09/13/2013 Physical Exam  Constitutional: She is oriented to person, place, and time. She appears well-developed and well-nourished.  HENT:  Head: Normocephalic and atraumatic.  Uvula is midline, no obvious tongue swelling. Lower lip symmetric edema. No hives or erythema.   Eyes: EOM are normal. Pupils are equal, round, and reactive to light.  Neck:  Steri-Strips in place s/p thyroidectomy with postoperative swelling  Cardiovascular: Normal rate, regular rhythm and intact  distal pulses.   Pulmonary/Chest: Effort normal and breath sounds normal. No respiratory distress. She has no wheezes.  Abdominal: Soft. She exhibits no distension. There is no tenderness.  obese  Musculoskeletal: Normal range of motion. She exhibits no edema.  Neurological: She is alert and oriented to person, place, and time.  Skin: Skin is warm and dry.    ED Course  Procedures (including critical care time) Labs Review Labs Reviewed  CBC - Abnormal; Notable for the following:    WBC 12.0 (*)    Hemoglobin 9.3 (*)    HCT 29.8 (*)    MCV 76.8 (*)    MCH 24.0 (*)    RDW 16.5 (*)    All other components within normal limits  POCT I-STAT, CHEM 8 - Abnormal; Notable for the following:    Potassium 3.0 (*)    BUN 4 (*)    Glucose, Bld 107 (*)    Calcium, Ion 1.24 (*)    Hemoglobin 10.9 (*)    HCT 32.0 (*)    All other components within normal limits   Imaging Review No results found.  EpiPen. Solu-Medrol, Pepcid, Benadryl IV  recheck after medications, symptomatically improved 1:48 AM discussed with general surgery Dr. Ezzard Standing, recommends serum calcium level now, he suspects symptoms are not allergic reaction and more likely postoperative. If her serum calcium is normal can followup in the office as needed.  3:27 AM DR Ezzard Standing evaluated bedside, plan follow up in clinic in am, serum calcium pending. OK to RX steroids but still doubts allergic reaction   3:37 AM Serum calcium on downtime slip reported 9.6  Plan d/c home and follow up today in clinic MDM  Diagnosis: Allergic reaction versus post op swelling  Labs obtained/ reviewed  Medications provided Serial evaluations with improved condition VS and nurses notes reviewed   Sunnie Nielsen, MD 10/11/13 [phone]

## 2013-10-11 NOTE — ED Notes (Signed)
Pt had thyroidectomy Friday morning; developed swelling of throat and lip swelling x 90 mins; shortness of breath; throat scratchy; hives

## 2013-10-11 NOTE — Telephone Encounter (Signed)
Dr. Gerrit Friends called back to state that he is unsure about these symptoms especially since Calcium is normal.  He states that patient can go ahead and take prescribed medication.  Dr. Gerrit Friends also states that patient can come into urgent office if she feels she needs too.  I called and spoke to the patient to update her.  Patient states that if she continues to have trouble then she will call back and come in but she is just going to wait for right now.

## 2013-10-11 NOTE — Progress Notes (Signed)
Quick Note:  Please contact patient and notify of benign pathology results.  Tracey Hermance M. Dalia Jollie, MD, FACS [facility] Office: [phone]   ______ 

## 2013-10-11 NOTE — Telephone Encounter (Signed)
Called patient to let her know path results.

## 2013-10-12 ENCOUNTER — Other Ambulatory Visit (INDEPENDENT_AMBULATORY_CARE_PROVIDER_SITE_OTHER): Payer: Self-pay

## 2013-10-12 ENCOUNTER — Telehealth (INDEPENDENT_AMBULATORY_CARE_PROVIDER_SITE_OTHER): Payer: Self-pay

## 2013-10-12 NOTE — Telephone Encounter (Signed)
Lab slip mailed to pt. 

## 2013-10-14 ENCOUNTER — Other Ambulatory Visit (INDEPENDENT_AMBULATORY_CARE_PROVIDER_SITE_OTHER): Payer: Self-pay

## 2013-10-14 ENCOUNTER — Telehealth (INDEPENDENT_AMBULATORY_CARE_PROVIDER_SITE_OTHER): Payer: Self-pay | Admitting: *Deleted

## 2013-10-14 NOTE — Telephone Encounter (Signed)
Patient called to report tingling in her hands and shaking of her hands.  Spoke to Radley LPN who stated that I needed to page Dr. Gerrit Friends to ask him about it.  Patient updated that we will speak with Dr. Gerrit Friends then let her know the POC.  Patient states understanding and agreeable at this time.  Dr. Gerrit Friends paged at this time, awaiting call back.

## 2013-10-14 NOTE — Telephone Encounter (Signed)
Reviewed with Dr Gerrit Friends. Per Dr Gerrit Friends pt advised to go to lab today for stat BMET and possible urgent office appointment. Pt states she does not have transportation until tomorrow AM. Pt will got to lab 12-12 before 10:00am for stat labs. Pt advised if symptoms become worse she may need to go to ER tonight to have symptoms evaluated and labs drawn. Pt states she understands. Pt aware order at front desk for pt to pick up in AM for stat labs.

## 2013-10-15 ENCOUNTER — Telehealth (INDEPENDENT_AMBULATORY_CARE_PROVIDER_SITE_OTHER): Payer: Self-pay

## 2013-10-15 LAB — BASIC METABOLIC PANEL
BUN: 10 mg/dL (ref 6–23)
CO2: 24 mEq/L (ref 19–32)
Calcium: 9.6 mg/dL (ref 8.4–10.5)
Glucose, Bld: 115 mg/dL — ABNORMAL HIGH (ref 70–99)
Potassium: 3.2 mEq/L — ABNORMAL LOW (ref 3.5–5.3)
Sodium: 140 mEq/L (ref 135–145)

## 2013-10-15 NOTE — Telephone Encounter (Signed)
Diamond Gula, LPN at 40/98/1191 11:25 AM     Status: Signed        I called pt to check on symptoms. Pt states she only has trembling of hands and arms at bedtime. Not during the day. No tingling. Pt states this is not worse and may be a bit better last night. Pt advised her calcium level is within normal limits and has not changed from 4 days ago. Pt states she was given prednisone at ER on 10-11-13. She thinks this may be what is causing the symptom. Pt advised I will send msg to Dr Gerrit Friends to review labs and we will call her back if he has any action. Pt will call with any concerns.      Calcium level is normal.  Symptoms are not a complication of her thyroid surgery.  Will see at [facility] office for regularly scheduled follow up.  If symptoms persist, she should be evaluated by her primary care provider.  tmg  Velora Heckler, MD, [facility] Office: [phone]

## 2013-10-15 NOTE — Telephone Encounter (Signed)
I called Diamond [name] to check on symptoms. Diamond [name] states she only has trembling of hands and arms at bedtime. Not during the day. No tingling. Diamond [name] states this is not worse and may be a bit better last night. Diamond [name] advised her calcium level is within normal limits and has not changed from 4 days ago. Diamond [name] states she was given prednisone at ER on 10-11-13. She thinks this may be what is causing the symptom. Diamond [name] advised I will send msg to Dr Gerrit Friends to review labs and we will call her back if he has any action. Diamond [name] will call with any concerns.

## 2013-10-15 NOTE — Telephone Encounter (Signed)
Diamond [name] states that she spoke to Dr. Gerrit Friends who stated that patient's labs are ok so this could be coming from the Prednisone which the ED prescribed the patient.  Offered patient urgent office appt however she states she is just going to stop the Prednisone to see if the symptoms will resolve on there own.  Patient instructed to call back if she has any further concerns.

## 2013-10-27 ENCOUNTER — Telehealth: Payer: Self-pay | Admitting: Family

## 2013-10-27 DIAGNOSIS — R739 Hyperglycemia, unspecified: Secondary | ICD-10-CM

## 2013-10-27 DIAGNOSIS — E876 Hypokalemia: Secondary | ICD-10-CM

## 2013-10-27 MED ORDER — POTASSIUM CHLORIDE CRYS ER 20 MEQ PO TBCR: 20.0000 meq | EXTENDED_RELEASE_TABLET | Freq: Every day | ORAL | Status: DC

## 2013-10-27 NOTE — Telephone Encounter (Signed)
Notified pt and she voices understanding. Future lab orders entered.

## 2013-10-27 NOTE — Telephone Encounter (Addendum)
Please call pt and let her know that I received lab work from Dr. Gerrit Friends showing low potassium.  I would like for her to add Kdur 20 MEQ once daily and repeat bmet in 1 week (dx hypokalemia).  Also, sugar was elevated and I would like her to complete A1C (dx hyperglycemia).

## 2013-11-01 LAB — CALCIUM: Calcium: 9.6 mg/dL (ref 8.4–10.5)

## 2013-11-03 ENCOUNTER — Ambulatory Visit (INDEPENDENT_AMBULATORY_CARE_PROVIDER_SITE_OTHER): Payer: BC Managed Care – PPO | Admitting: Surgery

## 2013-11-03 ENCOUNTER — Encounter (INDEPENDENT_AMBULATORY_CARE_PROVIDER_SITE_OTHER): Payer: Self-pay | Admitting: Surgery

## 2013-11-03 VITALS — BP 140/98 | HR 80 | Temp 98.6°F | Resp 15 | Ht 69.0 in | Wt 304.4 lb

## 2013-11-03 DIAGNOSIS — E042 Nontoxic multinodular goiter: Secondary | ICD-10-CM

## 2013-11-03 NOTE — Patient Instructions (Signed)
  COCOA BUTTER & VITAMIN E CREAM  (Palmer's or other brand)  Apply cocoa butter/vitamin E cream to your incision 2 - 3 times daily.  Massage cream into incision for one minute with each application.  Use sunscreen (50 SPF or higher) for first 6 months after surgery if area is exposed to sun.  You may substitute Mederma or other scar reducing creams as desired.   

## 2013-11-03 NOTE — Progress Notes (Signed)
General Surgery [facility]  Chief Complaint  Patient presents with  . Routine Post Op    total thyroidectomy 10/08/2013    HISTORY: Patient is a 27 year old female who underwent total thyroidectomy for multinodular thyroid goiter with compressive symptoms on 10/08/2013. Final pathology shows nodular hyperplasia. There was no evidence of malignancy. Postoperative serum calcium level remains normal at 9.6. Patient is taking Synthroid 100 mcg daily.  EXAM: Surgical wound is well healed. Good cosmetic result. Mild soft tissue swelling. No sign of seroma. No sign of infection. Voice is relatively normal I conversational level, but there is slight hoarseness present.  IMPRESSION: Status post total thyroidectomy for multinodular goiter  PLAN: Patient will begin applying topical creams to her incision. We will check her TSH level in 2 weeks. She will return in 6 weeks for final wound check and for assessment of her voice quality.  Velora Heckler, MD, FACS General & Endocrine Surgery [facility]   Visit Diagnoses: 1. Multinodular goiter

## 2013-11-09 ENCOUNTER — Ambulatory Visit: Payer: BC Managed Care – PPO | Admitting: Internal Medicine

## 2013-11-18 ENCOUNTER — Encounter: Payer: Self-pay | Admitting: Internal Medicine

## 2013-11-18 ENCOUNTER — Ambulatory Visit (INDEPENDENT_AMBULATORY_CARE_PROVIDER_SITE_OTHER): Payer: BC Managed Care – PPO | Admitting: Internal Medicine

## 2013-11-18 VITALS — BP 118/64 | HR 82 | Temp 98.2°F | Resp 12 | Wt 308.0 lb

## 2013-11-18 DIAGNOSIS — E042 Nontoxic multinodular goiter: Secondary | ICD-10-CM

## 2013-11-18 DIAGNOSIS — R739 Hyperglycemia, unspecified: Secondary | ICD-10-CM

## 2013-11-18 DIAGNOSIS — R7309 Other abnormal glucose: Secondary | ICD-10-CM

## 2013-11-18 LAB — HEMOGLOBIN A1C: Hgb A1c MFr Bld: 5.6 % (ref 4.6–6.5)

## 2013-11-18 LAB — TSH: TSH: 12.06 u[IU]/mL — ABNORMAL HIGH (ref 0.35–5.50)

## 2013-11-18 LAB — T4, FREE: FREE T4: 0.91 ng/dL (ref 0.60–1.60)

## 2013-11-18 MED ORDER — SYNTHROID 112 MCG PO TABS: 112.0000 ug | ORAL_TABLET | Freq: Every day | ORAL | Status: DC

## 2013-11-18 NOTE — Progress Notes (Signed)
Patient ID: Diamond [name], female   DOB: 1986-09-12, 28 y.o.   MRN: [id number]   HPI  Diamond [name] is a 28 y.o.-year-old female, returning for f/u for MNG.  Reviewed hx: Patient was diagnosed with enlarged thyroid approximately 1 year ago - at [facility]. She had a thyroid ultrasound that showed 3 thyroid nodules. The 2 largest nodules were biopsied >> benign (10/2012).     At last visit, she was describing a "lump" mid neck - for 2 years, + hoarseness for 2 years, + dysphagia - meats but also swallowing saliva laying down/no odynophagia, + SOB with lying down.  A Barium swallow showed mild impression from thyroid on Esophagus >> I referred her to surgery >> had total thyroidectomy 10/08/2013 >> final pathology shows nodular hyperplasia. Postoperative serum calcium level was normal at 9.6. She was started on Synthroid 100 mcg daily. Last visit with Dr Gerrit Friends was on 11/03/2013.  Feels good after the surgery, no more dysphagia. Developed an allergic rxn after she was discharged >> ED >> Epi pen, Prednisone, Benadryl, but unclear what caused it. No more episodes after this.  She takes the Synthroid in am, fasting, with water, takes calcium and iron at lunchtime and dinner. She takes calcium 2x a day but will stop as she finishes the bottle (has 10 more tabs).  I reviewed pt's thyroid latest tests: Lab Results  Component Value Date   TSH 1.087 07/12/2013   FREET4 1.27 07/12/2013   Pt c/o: - denies heat intolerance/cold intolerance - no tremors - no anxiety/depression - no palpitations - no problems with concentration - no fatigue - no hyperdefecation/constipation - no weight loss - no weight gain - no dry skin - no hair loss   She has tingling at the cervical scan level >> only after applying Cocoa butter.   I reviewed her chart and she also has a history of IDA 3 years ago. She is on iron - separated from Synthroid and from calcium.  ROS: Constitutional: no weight  gain/loss, no fatigue, no subjective hyperthermia/hypothermia Eyes: no blurry vision, no xerophthalmia ENT: no sore throat, no nodules palpated in throat, no dysphagia/no odynophagia, no hoarseness Cardiovascular: no CP/SOB/palpitations/leg swelling Respiratory: no cough/SOB Gastrointestinal: no N/V/D/C Musculoskeletal: no muscle/joint aches Skin: no rashes Neurological: no tremors/numbness/tingling/dizziness  I reviewed pt's medications, allergies, PMH, social hx, family hx and no changes required, except as mentioned above.  PE: BP 118/64  Pulse 82  Temp(Src) 98.2 F (36.8 C) (Oral)  Resp 12  Wt 308 lb (139.708 kg)  SpO2 98% Body mass index is 45.46 kg/(m^2).  Wt Readings from Last 3 Encounters:  11/18/13 308 lb (139.708 kg)  11/03/13 304 lb 6.4 oz (138.075 kg)  10/11/13 303 lb (137.44 kg)   Constitutional: obese, in NAD Eyes: PERRLA, EOMI, + bilat. exophthalmos  ENT: moist mucous membranes, cervical scar healing well, no cervical lymphadenopathy Cardiovascular: RRR, No MRG Respiratory: CTA B Gastrointestinal: abdomen soft, NT, ND, BS+ Musculoskeletal: no deformities, strength intact in all 4;  Skin: moist, warm, no rashes  ASSESSMENT: 1. MNG  - thyroid ultrasound at [facility] 09/09/2012:  Right lobe is 7.3 x 2.5 x 2.2 cm. On the right, there is a solid 2.2 x 1.2 x 1.6 cm mass. The second mass measures 1.0 x 1.2 x 1.2 cm. A third mass measures 1.6 x 1.1 x 1.3 cm. There are several subcentimeter masses on the right as well.  Left lobe is 8.0 x 3.8 x 2.8 cm There is a  mass in the left lobe measuring 5.0 x 4.9 x 3.9 cm, with few tiny calcifications.  Isthmus is 8 mm in thickness.  In the isthmus, there is a mass measuring 1.8 x 1.7 x 1.4 cm all masses are solid. Enlarged thyroid with multiple solid masses.  - thyroid FNA (10/2012):   left sided 4 cm nodule: benign, with Hurthle cell and cystic changes  right-sided 2 cm nodule: benign, with Hurthle cell and  cystic changes  - Ba swallow 08/27/2013: Slight impression on the anterior lower cervical esophagus, possibly related to the patient's known multinodular goiter. Brief sticking of the 13 mm barium tablet in the cervical esophagus.  - total thyroidectomy 10/08/2013 >> benign path  Plan: 1. MNG  - Pt was referred to surgery due to her dysphagia and hoarseness and the Ba Es report of thyroid compressing the Es >> she had total thyroidectomy 30 mo ago >> feels good after the surgery, no more neck compression sxs - she started to have tingling along the cervical scar after started to use Cocoa butter. This can be from the healing process but also can be allergy to an ingredient in this. Advised to try to apply Mederma instead - check TFTs today - I'll see her back in 4 mo  Office Visit on 11/18/2013  Component Date Value Range Status  . Hemoglobin A1C 11/18/2013 5.6  4.6 - 6.5 % Final   Glycemic Control Guidelines for People with Diabetes:Non Diabetic:  <6%Goal of Therapy: <7%Additional Action Suggested:  >8%   . TSH 11/18/2013 12.06* 0.35 - 5.50 uIU/mL Final  . Free T4 11/18/2013 0.91  0.60 - 1.60 ng/dL Final   Msg sent: Dear Ms Diamond [name], No evidence of prediabetes or diabetes. Your TSH is high, so let's increase the Synthroid dose to 112 mcg daily. I will call this in. Let's have you back for labs in 6 weeks. Sincerely, Carlus Pavlovristina Jones Viviani MD

## 2013-11-18 NOTE — Patient Instructions (Signed)
Please come back for a follow-up appointment in 4 months. Try Mederma on your scar. Stop at the lab.

## 2013-12-13 ENCOUNTER — Ambulatory Visit (INDEPENDENT_AMBULATORY_CARE_PROVIDER_SITE_OTHER): Payer: BC Managed Care – PPO | Admitting: Surgery

## 2013-12-13 ENCOUNTER — Encounter (INDEPENDENT_AMBULATORY_CARE_PROVIDER_SITE_OTHER): Payer: Self-pay | Admitting: Surgery

## 2013-12-13 VITALS — BP 138/96 | HR 72 | Temp 98.5°F | Resp 14 | Ht 69.0 in | Wt 311.8 lb

## 2013-12-13 DIAGNOSIS — E890 Postprocedural hypothyroidism: Secondary | ICD-10-CM | POA: Insufficient documentation

## 2013-12-13 DIAGNOSIS — E042 Nontoxic multinodular goiter: Secondary | ICD-10-CM

## 2013-12-13 HISTORY — DX: Postprocedural hypothyroidism: E89.0

## 2013-12-13 NOTE — Progress Notes (Signed)
General Surgery [facility]  Chief Complaint  Patient presents with  . Routine Post Op    total thyroidectomy 10/08/2014    HISTORY: Patient is a 28 year old female who underwent total thyroidectomy in December 2014. Patient had a TSH level in mid January which was elevated at 12.06. Her Synthroid dosage was increased by her endocrinologist to 112 mcg daily. She will have repeat laboratories in approximately 6 weeks.  Patient states that her voice is becoming stronger. She is no longer having dysphagia. She denies any shortness of breath.  EXAM: Surgical wound is healing nicely with unacceptable cosmetic result. Mild induration. No sign of infection. No sign of seroma. Voice is relatively normal at conversational level.  IMPRESSION: Status post total thyroidectomy, benign final pathology  PLAN: Patient has iatrogenic hypothyroidism. She will continue close evaluation by her endocrinologist with adjustment of her thyroid replacement dosage as needed. Patient will continue to apply topical creams to her incision.  Patient will return for surgical care as needed.  Velora Hecklerodd M. Lavana Huckeba, MD, FACS General & Endocrine Surgery [facility]   Visit Diagnoses: 1. Multinodular goiter   2. Hypothyroidism, postsurgical

## 2014-02-18 ENCOUNTER — Ambulatory Visit: Payer: BC Managed Care – PPO | Admitting: Family Medicine

## 2014-02-23 ENCOUNTER — Ambulatory Visit: Payer: BC Managed Care – PPO | Admitting: Family

## 2014-02-23 DIAGNOSIS — Z0289 Encounter for other administrative examinations: Secondary | ICD-10-CM

## 2014-03-03 ENCOUNTER — Other Ambulatory Visit: Payer: Self-pay | Admitting: Internal Medicine

## 2014-03-03 NOTE — Telephone Encounter (Signed)
Need to come back for thyroid labs before further refills.

## 2014-03-18 ENCOUNTER — Ambulatory Visit: Payer: BC Managed Care – PPO | Admitting: Internal Medicine

## 2014-03-22 ENCOUNTER — Ambulatory Visit (INDEPENDENT_AMBULATORY_CARE_PROVIDER_SITE_OTHER): Payer: BC Managed Care – PPO | Admitting: Internal Medicine

## 2014-03-22 ENCOUNTER — Encounter: Payer: Self-pay | Admitting: Internal Medicine

## 2014-03-22 VITALS — BP 120/72 | HR 74 | Temp 98.5°F | Resp 12 | Wt 317.0 lb

## 2014-03-22 DIAGNOSIS — E042 Nontoxic multinodular goiter: Secondary | ICD-10-CM

## 2014-03-22 DIAGNOSIS — E890 Postprocedural hypothyroidism: Secondary | ICD-10-CM

## 2014-03-22 LAB — TSH: TSH: 5.61 u[IU]/mL — ABNORMAL HIGH (ref 0.35–4.50)

## 2014-03-22 LAB — T4, FREE: Free T4: 0.92 ng/dL (ref 0.60–1.60)

## 2014-03-22 MED ORDER — SYNTHROID 125 MCG PO TABS: 125.0000 ug | ORAL_TABLET | Freq: Every day | ORAL | Status: DC

## 2014-03-22 NOTE — Patient Instructions (Signed)
Please stop at the lab.  Please come back for a follow-up appointment in 6 months.  

## 2014-03-22 NOTE — Progress Notes (Signed)
Patient ID: Diamond [name], female   DOB: 09/11/1986, 28 y.o.   MRN: [id number]   HPI  Diamond [name] is a 28 y.o.-year-old female, returning for f/u for MNG. Last visit 4 mo ago.  Reviewed hx: Patient was diagnosed with enlarged thyroid approximately 1 year ago - at [facility]. She had a thyroid ultrasound that showed 3 thyroid nodules. The 2 largest nodules were biopsied >> benign (10/2012).     She was describing a "lump" mid neck - for 2 years, + hoarseness for 2 years, + dysphagia - meats but also swallowing saliva laying down/no odynophagia, + SOB with lying down.  A Barium swallow showed mild impression from thyroid on Esophagus >> I referred her to surgery >> had total thyroidectomy 10/08/2013 >> final pathology shows nodular hyperplasia. Postoperative serum calcium level was normal at 9.6. She was started on Synthroid 100 mcg daily. Last visit with Dr Gerrit FriendsGerkin was on 12/13/2013.  I reviewed pt's thyroid latest tests: Lab Results  Component Value Date   TSH 12.06* 11/18/2013   TSH 1.087 07/12/2013   FREET4 0.91 11/18/2013   FREET4 1.27 07/12/2013   We increased the Synthroid to 112 after the last set of labs back. She takes the Synthroid in am, fasting, with water. She is taking iron in the afternoon.  She feels good after the surgery, no more dysphagia. She still has + hoarseness, + occasional sharp pain at the thyroidectomy scar site.  Pt c/o: - denies heat intolerance/cold intolerance - no tremors - no anxiety/depression - no palpitations - no problems with concentration - no fatigue - no hyperdefecation/constipation - + weight loss - + weight gain - no dry skin - no hair loss   Pt also has a history of IDA 3 years ago. She is on iron - separated from Synthroid. She was on Calcium, stopped after our last visit.  ROS: Constitutional: see HPI Eyes: no blurry vision, no xerophthalmia ENT: no sore throat, no nodules palpated in throat, no dysphagia/no odynophagia, no  hoarseness Cardiovascular: no CP/SOB/palpitations/leg swelling Respiratory: no cough/SOB Gastrointestinal: no N/V/D/C Musculoskeletal: no muscle/joint aches Skin: no rashes Neurological: no tremors/numbness/tingling/dizziness  I reviewed pt's medications, allergies, PMH, social hx, family hx and no changes required, except as mentioned above.  PE: BP 120/72  Pulse 74  Temp(Src) 98.5 F (36.9 C) (Oral)  Resp 12  Wt 317 lb (143.79 kg)  SpO2 98% Body mass index is 46.79 kg/(m^2).  Wt Readings from Last 3 Encounters:  03/22/14 317 lb (143.79 kg)  12/13/13 311 lb 12.8 oz (141.432 kg)  11/18/13 308 lb (139.708 kg)   Constitutional: obese, in NAD Eyes: PERRLA, EOMI, + bilat. exophthalmos  ENT: moist mucous membranes, cervical scar healing well, no cervical lymphadenopathy Cardiovascular: RRR, No MRG Respiratory: CTA B Gastrointestinal: abdomen soft, NT, ND, BS+ Musculoskeletal: no deformities, strength intact in all 4;  Skin: moist, warm, no rashes  ASSESSMENT: 1. MNG  - [facility] 09/09/2012:  Right lobe is 7.3 x 2.5 x 2.2 cm. On the right, there is a solid 2.2 x 1.2 x 1.6 cm mass. The second mass measures 1.0 x 1.2 x 1.2 cm. A third mass measures 1.6 x 1.1 x 1.3 cm. There are several subcentimeter masses on the right as well.  Left lobe is 8.0 x 3.8 x 2.8 cm There is a mass in the left lobe measuring 5.0 x 4.9 x 3.9 cm, with few tiny calcifications.  Isthmus is 8 mm in thickness.  In  the isthmus, there is a mass measuring 1.8 x 1.7 x 1.4 cm all masses are solid. Enlarged thyroid with multiple solid masses.  - thyroid FNA (10/2012):   left sided 4 cm nodule: benign, with Hurthle cell and cystic changes  right-sided 2 cm nodule: benign, with Hurthle cell and cystic changes  - Ba swallow 08/27/2013: Slight impression on the anterior lower cervical esophagus, possibly related to the patient's known multinodular goiter. Brief sticking of the 13  mm barium tablet in the cervical esophagus.  - total thyroidectomy 10/08/2013 >> benign path  Plan: 1. MNG  - Pt was referred to surgery due to her dysphagia and hoarseness and the Ba Es report of thyroid compressing the Es >> she had total thyroidectomy 5 mo ago >> feels good after the surgery, no more neck compression sxs, but still has hoarseness and occasional sharp pain at the surgical site - at last visit, we increased her Synthroid, but she did not return for repeat labs >> check TFTs today - will need refills of Synthroid when labs are back - I'll see her back in 6 mo  Office Visit on 03/22/2014  Component Date Value Ref Range Status  . TSH 03/22/2014 5.61* 0.35 - 4.50 uIU/mL Final  . Free T4 03/22/2014 0.92  0.60 - 1.60 ng/dL Final   Will increase the Synthroid to 125 mcg daily >> repeat labs in 6-8 weeks.

## 2014-03-24 ENCOUNTER — Telehealth: Payer: Self-pay | Admitting: Internal Medicine

## 2014-03-24 ENCOUNTER — Other Ambulatory Visit: Payer: Self-pay | Admitting: *Deleted

## 2014-03-24 MED ORDER — LEVOTHYROXINE SODIUM 125 MCG PO TABS: 125.0000 ug | ORAL_TABLET | Freq: Every day | ORAL | Status: DC

## 2014-03-24 NOTE — Telephone Encounter (Signed)
Patient states Dr. Elvera LennoxGherghe changed her synthroid; its too costly and insurance will not cover   Please advise patient   Thank you :)

## 2014-03-24 NOTE — Telephone Encounter (Signed)
Called Diamond [name] and lvm advising her that Dr Elvera LennoxGherghe has sent in a new rx for the generic of the Synthroid, levothyroxine. Call us and let us know if this is more cost effective.

## 2014-03-24 NOTE — Telephone Encounter (Signed)
Please read note below and advise.  

## 2014-03-24 NOTE — Telephone Encounter (Signed)
In her chart it appeared that she was on the DAW...let's send Levothyroxine then at the same dose. I apologize for the confusion.

## 2014-05-04 ENCOUNTER — Telehealth: Payer: Self-pay | Admitting: *Deleted

## 2014-05-04 ENCOUNTER — Telehealth: Payer: Self-pay | Admitting: Internal Medicine

## 2014-05-04 NOTE — Telephone Encounter (Signed)
Called pt and advised pt she can get her labs done at her PCP, Melissa O'Sullivan's. Advised her that we are in the same system.

## 2014-05-04 NOTE — Telephone Encounter (Signed)
Patient would like to know if she can do her labs with her PCP  Thank You

## 2014-05-04 NOTE — Telephone Encounter (Signed)
Absolutely: TSH and free T4.

## 2014-05-04 NOTE — Telephone Encounter (Signed)
Pt left message that she had procedure in December and was supposed to return in 5-6 weeks?. Left message for pt to return my call and clarify message.

## 2014-05-04 NOTE — Telephone Encounter (Signed)
Please read note below and advise.  

## 2014-05-23 ENCOUNTER — Telehealth: Payer: Self-pay | Admitting: Internal Medicine

## 2014-05-23 ENCOUNTER — Other Ambulatory Visit: Payer: Self-pay | Admitting: *Deleted

## 2014-05-23 DIAGNOSIS — E890 Postprocedural hypothyroidism: Secondary | ICD-10-CM

## 2014-05-23 NOTE — Telephone Encounter (Signed)
Lab orders released.  

## 2014-05-23 NOTE — Telephone Encounter (Signed)
Release the lab orders for Dr. Brayton CavesSullivan to perform the draw

## 2014-05-27 ENCOUNTER — Telehealth: Payer: Self-pay | Admitting: Internal Medicine

## 2014-05-27 ENCOUNTER — Other Ambulatory Visit (INDEPENDENT_AMBULATORY_CARE_PROVIDER_SITE_OTHER): Payer: BC Managed Care – PPO

## 2014-05-27 ENCOUNTER — Other Ambulatory Visit: Payer: Self-pay | Admitting: *Deleted

## 2014-05-27 DIAGNOSIS — E890 Postprocedural hypothyroidism: Secondary | ICD-10-CM

## 2014-05-27 LAB — T4, FREE: Free T4: 1.01 ng/dL (ref 0.60–1.60)

## 2014-05-27 LAB — TSH: TSH: 3.27 u[IU]/mL (ref 0.35–4.50)

## 2014-05-27 NOTE — Telephone Encounter (Signed)
Lab orders faxed to [phone].

## 2014-05-27 NOTE — Telephone Encounter (Signed)
Diamond PoundsMelissa OSullivans' office needs us to send the lab orders to them for pt to get blood draw she is going today # fax [phone]

## 2014-09-06 ENCOUNTER — Other Ambulatory Visit: Payer: Self-pay | Admitting: Internal Medicine

## 2014-09-12 ENCOUNTER — Other Ambulatory Visit: Payer: BC Managed Care – PPO

## 2014-09-12 ENCOUNTER — Other Ambulatory Visit: Payer: Self-pay | Admitting: *Deleted

## 2014-09-12 ENCOUNTER — Other Ambulatory Visit (INDEPENDENT_AMBULATORY_CARE_PROVIDER_SITE_OTHER): Payer: BC Managed Care – PPO

## 2014-09-12 ENCOUNTER — Encounter: Payer: Self-pay | Admitting: Internal Medicine

## 2014-09-12 DIAGNOSIS — E890 Postprocedural hypothyroidism: Secondary | ICD-10-CM

## 2014-09-12 LAB — TSH: TSH: 8.18 u[IU]/mL — ABNORMAL HIGH (ref 0.35–4.50)

## 2014-09-12 LAB — T4, FREE: Free T4: 0.93 ng/dL (ref 0.60–1.60)

## 2014-09-13 ENCOUNTER — Other Ambulatory Visit: Payer: Self-pay | Admitting: *Deleted

## 2014-09-13 MED ORDER — LEVOTHYROXINE SODIUM 125 MCG PO TABS: 125.0000 ug | ORAL_TABLET | Freq: Every day | ORAL | Status: DC

## 2014-09-13 NOTE — Telephone Encounter (Signed)
Rx did not go through to pharmacy. Resent.

## 2014-10-12 ENCOUNTER — Other Ambulatory Visit: Payer: BC Managed Care – PPO

## 2015-01-03 ENCOUNTER — Other Ambulatory Visit: Payer: Self-pay

## 2015-01-03 ENCOUNTER — Telehealth: Payer: Self-pay | Admitting: Internal Medicine

## 2015-01-03 MED ORDER — LEVOTHYROXINE SODIUM 125 MCG PO TABS: 125.0000 ug | ORAL_TABLET | Freq: Every day | ORAL | Status: DC

## 2015-01-03 NOTE — Telephone Encounter (Signed)
[facility]

## 2015-01-03 NOTE — Telephone Encounter (Signed)
Need refill on [facility]othyroxime [facility]

## 2015-01-06 ENCOUNTER — Other Ambulatory Visit (INDEPENDENT_AMBULATORY_CARE_PROVIDER_SITE_OTHER): Payer: Self-pay

## 2015-01-06 ENCOUNTER — Other Ambulatory Visit: Payer: Self-pay | Admitting: *Deleted

## 2015-01-06 DIAGNOSIS — E890 Postprocedural hypothyroidism: Secondary | ICD-10-CM

## 2015-01-06 LAB — TSH: TSH: 9.45 u[IU]/mL — ABNORMAL HIGH (ref 0.35–4.50)

## 2015-01-06 LAB — T4, FREE: Free T4: 0.99 ng/dL (ref 0.60–1.60)

## 2015-01-09 ENCOUNTER — Other Ambulatory Visit: Payer: Self-pay | Admitting: *Deleted

## 2015-01-09 MED ORDER — LEVOTHYROXINE SODIUM 137 MCG PO TABS: 137.0000 ug | ORAL_TABLET | Freq: Every day | ORAL | Status: DC

## 2015-02-05 ENCOUNTER — Other Ambulatory Visit: Payer: Self-pay | Admitting: Internal Medicine

## 2015-02-06 ENCOUNTER — Other Ambulatory Visit: Payer: Self-pay | Admitting: *Deleted

## 2015-02-06 MED ORDER — LEVOTHYROXINE SODIUM 137 MCG PO TABS: 137.0000 ug | ORAL_TABLET | Freq: Every day | ORAL | Status: DC

## 2015-02-09 ENCOUNTER — Other Ambulatory Visit: Payer: Self-pay | Admitting: Internal Medicine

## 2015-03-10 ENCOUNTER — Other Ambulatory Visit: Payer: Self-pay | Admitting: Internal Medicine

## 2015-03-10 NOTE — Telephone Encounter (Signed)
NEEDS APPT FOR FURTHER REFILLS

## 2015-03-10 NOTE — Telephone Encounter (Signed)
OK to refill 1 month. I need to see her before I can give her further refills.

## 2015-03-11 ENCOUNTER — Other Ambulatory Visit: Payer: Self-pay | Admitting: Internal Medicine

## 2015-04-24 ENCOUNTER — Telehealth: Payer: Self-pay | Admitting: Endocrinology

## 2015-04-24 ENCOUNTER — Other Ambulatory Visit: Payer: Self-pay | Admitting: Internal Medicine

## 2015-04-24 MED ORDER — LEVOTHYROXINE SODIUM 137 MCG PO TABS: ORAL_TABLET | ORAL | Status: DC

## 2015-04-24 NOTE — Telephone Encounter (Signed)
Per Dr. Charlean Sanfilippo refill request on 5/11, patient is no longer under her care, she needs to get this from her PCP

## 2015-04-24 NOTE — Telephone Encounter (Signed)
Patient called and would like a refill on her medication    Rx: Levothyroxine  [facility] [address]   Thank you

## 2015-04-26 ENCOUNTER — Other Ambulatory Visit: Payer: Self-pay

## 2015-04-27 ENCOUNTER — Other Ambulatory Visit: Payer: BLUE CROSS/BLUE SHIELD

## 2015-06-03 ENCOUNTER — Other Ambulatory Visit: Payer: Self-pay | Admitting: Internal Medicine

## 2015-07-11 ENCOUNTER — Other Ambulatory Visit: Payer: Self-pay | Admitting: Internal Medicine

## 2015-07-14 ENCOUNTER — Other Ambulatory Visit: Payer: Self-pay | Admitting: Internal Medicine

## 2015-07-25 ENCOUNTER — Telehealth: Payer: Self-pay | Admitting: Internal Medicine

## 2015-07-25 NOTE — Telephone Encounter (Signed)
Pt has 2 days left of the levothyroxine, [facility] [address]

## 2015-07-25 NOTE — Telephone Encounter (Signed)
Called pt and lm advising that she needs a lab appt before we can refill her medication.

## 2015-07-28 ENCOUNTER — Other Ambulatory Visit: Payer: Self-pay | Admitting: Internal Medicine

## 2015-07-28 ENCOUNTER — Other Ambulatory Visit (INDEPENDENT_AMBULATORY_CARE_PROVIDER_SITE_OTHER): Payer: BLUE CROSS/BLUE SHIELD

## 2015-07-28 ENCOUNTER — Other Ambulatory Visit: Payer: Self-pay | Admitting: *Deleted

## 2015-07-28 DIAGNOSIS — E890 Postprocedural hypothyroidism: Secondary | ICD-10-CM | POA: Diagnosis not present

## 2015-07-28 LAB — T4, FREE: FREE T4: 1.16 ng/dL (ref 0.60–1.60)

## 2015-07-28 LAB — TSH: TSH: 3.67 u[IU]/mL (ref 0.35–4.50)

## 2015-07-31 ENCOUNTER — Other Ambulatory Visit: Payer: Self-pay | Admitting: *Deleted

## 2015-07-31 ENCOUNTER — Telehealth: Payer: Self-pay | Admitting: Internal Medicine

## 2015-07-31 MED ORDER — LEVOTHYROXINE SODIUM 137 MCG PO TABS: ORAL_TABLET | ORAL | Status: DC

## 2015-07-31 NOTE — Telephone Encounter (Signed)
Pt needs refill on [facility]othyroxine [facility]

## 2015-07-31 NOTE — Telephone Encounter (Signed)
Called pt and lvm advising her that we sent her refill in this morning.

## 2015-09-12 ENCOUNTER — Ambulatory Visit: Payer: BLUE CROSS/BLUE SHIELD | Admitting: Internal Medicine

## 2015-09-19 ENCOUNTER — Ambulatory Visit (INDEPENDENT_AMBULATORY_CARE_PROVIDER_SITE_OTHER): Payer: BLUE CROSS/BLUE SHIELD | Admitting: Internal Medicine

## 2015-09-19 ENCOUNTER — Encounter: Payer: Self-pay | Admitting: Internal Medicine

## 2015-09-19 VITALS — BP 112/70 | HR 73 | Temp 97.8°F | Resp 12 | Wt 313.8 lb

## 2015-09-19 DIAGNOSIS — E890 Postprocedural hypothyroidism: Secondary | ICD-10-CM

## 2015-09-19 LAB — TSH: TSH: 8.87 u[IU]/mL — AB (ref 0.35–4.50)

## 2015-09-19 LAB — T4, FREE: Free T4: 0.98 ng/dL (ref 0.60–1.60)

## 2015-09-19 MED ORDER — LEVOTHYROXINE SODIUM 150 MCG PO TABS: 150.0000 ug | ORAL_TABLET | Freq: Every day | ORAL | Status: DC

## 2015-09-19 NOTE — Patient Instructions (Signed)
Please stop at the lab.  Please continue Levothyroxine 137 mcg daily.  Take the thyroid hormone every day, with water, >30 minutes before breakfast, separated by >4 hours from acid reflux medications, calcium, iron, multivitamins.  Please return in 1 year.

## 2015-09-19 NOTE — Progress Notes (Signed)
Patient ID: Diamond RetortDominique Tandy, female   DOB: 02/12/1986, 29 y.o.   MRN: [id number]   HPI  Diamond [name] is a 29 y.o.-year-old female, returning for f/u for postsurgical hypothyroidism after thyroidectomy for MNG. Last visit 1.5 years ago.  Reviewed hx: Patient was diagnosed with enlarged thyroid approximately 1 year ago - at [facility]. She had a thyroid ultrasound that showed 3 thyroid nodules. The 2 largest nodules were biopsied >> benign (10/2012).     She was describing a "lump" mid neck - for 2 years, + hoarseness for 2 years, + dysphagia - meats but also swallowing saliva laying down/no odynophagia, + SOB with lying down.  A Barium swallow showed mild impression from thyroid on Esophagus >> I referred her to surgery >> had total thyroidectomy 10/08/2013 >> final pathology shows nodular hyperplasia. Postoperative serum calcium level was normal at 9.6. She was started on Synthroid 100 mcg daily. Last visit with Dr Gerrit FriendsGerkin was on 12/13/2013.  I reviewed pt's thyroid latest tests: Lab Results  Component Value Date   TSH 3.67 07/28/2015   TSH 9.45* 01/06/2015   TSH 8.18* 09/12/2014   TSH 3.27 05/27/2014   TSH 5.61* 03/22/2014   TSH 12.06* 11/18/2013   TSH 1.087 07/12/2013   FREET4 1.16 07/28/2015   FREET4 0.99 01/06/2015   FREET4 0.93 09/12/2014   FREET4 1.01 05/27/2014   FREET4 0.92 03/22/2014   FREET4 0.91 11/18/2013   FREET4 1.27 07/12/2013   We increased the LT4 to 125 mcg in 03/2015, then 137 mcg in 03/2015. She takes the Synthroid in am, fasting, with water. She was taking iron in the afternoon, now off. No Ca, MVI, PPI.  She feels good after the surgery, no dysphagia. + persistent hoarseness.  She has: - no heat intolerance/cold intolerance - no tremors - no anxiety/depression - no palpitations - no problems with concentration - no fatigue - no hyperdefecation/constipation - no weight loss/weight gain - no dry skin - no hair loss   Pt also has a history of  IDA 3 years ago. She is on iron - separated from Synthroid. She was on Calcium, stopped after our last visit.  ROS: Constitutional: see HPI Eyes: no blurry vision, no xerophthalmia ENT: no sore throat, no nodules palpated in throat, no dysphagia/no odynophagia, no hoarseness Cardiovascular: no CP/SOB/palpitations/leg swelling Respiratory: no cough/SOB Gastrointestinal: no N/V/D/C Musculoskeletal: no muscle/joint aches Skin: no rashes Neurological: no tremors/numbness/tingling/dizziness  I reviewed pt's medications, allergies, PMH, social hx, family hx, and changes were documented in the history of present illness. Otherwise, unchanged from my initial visit note.  PE: BP 112/70 mmHg  Pulse 73  Temp(Src) 97.8 F (36.6 C) (Oral)  Resp 12  Wt 313 lb 12.8 oz (142.339 kg)  SpO2 99% Body mass index is 46.32 kg/(m^2).  Wt Readings from Last 3 Encounters:  09/19/15 313 lb 12.8 oz (142.339 kg)  03/22/14 317 lb (143.79 kg)  12/13/13 311 lb 12.8 oz (141.432 kg)   Constitutional: obese, in NAD Eyes: PERRLA, EOMI, + bilat. exophthalmos  ENT: moist mucous membranes, cervical scar healing well, no cervical lymphadenopathy Cardiovascular: RRR, No MRG Respiratory: CTA B Gastrointestinal: abdomen soft, NT, ND, BS+ Musculoskeletal: no deformities, strength intact in all 4;  Skin: moist, warm, no rashes  ASSESSMENT: 1. MNG  - [facility] 09/09/2012:  Right lobe is 7.3 x 2.5 x 2.2 cm. On the right, there is a solid 2.2 x 1.2 x 1.6 cm mass. The second mass measures 1.0 x 1.2  x 1.2 cm. A third mass measures 1.6 x 1.1 x 1.3 cm. There are several subcentimeter masses on the right as well.  Left lobe is 8.0 x 3.8 x 2.8 cm There is a mass in the left lobe measuring 5.0 x 4.9 x 3.9 cm, with few tiny calcifications.  Isthmus is 8 mm in thickness.  In the isthmus, there is a mass measuring 1.8 x 1.7 x 1.4 cm all masses are solid. Enlarged thyroid with multiple solid  masses.  - thyroid FNA (10/2012):   left sided 4 cm nodule: benign, with Hurthle cell and cystic changes  right-sided 2 cm nodule: benign, with Hurthle cell and cystic changes  - Ba swallow 08/27/2013: Slight impression on the anterior lower cervical esophagus, possibly related to the patient's known multinodular goiter. Brief sticking of the 13 mm barium tablet in the cervical esophagus.  - total thyroidectomy 10/08/2013 >> benign path  Plan: 1. MNG  - Pt was referred to surgery due to her dysphagia and hoarseness and the Ba Es report of thyroid compressing the Es >> she had total thyroidectomy in 10/2013 >> feels good after the surgery, no neck compression sxs, but still has hoarseness (this has been going on even before the surgery, though). - We discussed to take the thyroid hormone every day, with water, >30 minutes before breakfast, separated by >4 hours from acid reflux medications, calcium, iron, multivitamins. She is taking it correctly. - she continues LT4 137 mcg daily, dose increased in 01/2015 - she appears euthyroid - will check TFTs today - will need refills of LT4 when labs are back - I'll see her back in 1 year  Office Visit on 09/19/2015  Component Date Value Ref Range Status  . TSH 09/19/2015 8.87* 0.35 - 4.50 uIU/mL Final  . Free T4 09/19/2015 0.98  0.60 - 1.60 ng/dL Final   TSH is high, while she appears to take the medication correctly. We'll increase the dose of levothyroxine to 150 g daily and repeat the labs in 2 months.

## 2015-12-18 ENCOUNTER — Other Ambulatory Visit: Payer: BLUE CROSS/BLUE SHIELD

## 2016-01-11 ENCOUNTER — Other Ambulatory Visit (INDEPENDENT_AMBULATORY_CARE_PROVIDER_SITE_OTHER): Payer: BLUE CROSS/BLUE SHIELD

## 2016-01-11 DIAGNOSIS — E890 Postprocedural hypothyroidism: Secondary | ICD-10-CM | POA: Diagnosis not present

## 2016-01-11 LAB — T4, FREE: FREE T4: 1.23 ng/dL (ref 0.60–1.60)

## 2016-01-11 LAB — TSH: TSH: 2.58 u[IU]/mL (ref 0.35–4.50)

## 2016-02-14 ENCOUNTER — Other Ambulatory Visit: Payer: Self-pay | Admitting: Internal Medicine

## 2016-02-19 ENCOUNTER — Telehealth: Payer: Self-pay | Admitting: Internal Medicine

## 2016-02-19 NOTE — Telephone Encounter (Signed)
[facility] the [facility]. Stated she need an Rx on thyroid medication. (Didn't state the name of medication.)  02/16/16  9:37

## 2016-02-19 NOTE — Telephone Encounter (Signed)
Check with pharmacy and med was refilled on 02/14/16 and they have it ready for pick-up, called pt and no answer so left voicemail letting her know Rx ready for pick up

## 2016-06-19 ENCOUNTER — Other Ambulatory Visit: Payer: Self-pay | Admitting: Internal Medicine

## 2016-06-25 ENCOUNTER — Other Ambulatory Visit: Payer: Self-pay

## 2016-06-25 ENCOUNTER — Telehealth: Payer: Self-pay | Admitting: Internal Medicine

## 2016-06-25 MED ORDER — LEVOTHYROXINE SODIUM 150 MCG PO TABS: ORAL_TABLET | ORAL | 0 refills | Status: DC

## 2016-06-25 NOTE — Telephone Encounter (Signed)
Patient need refill of levothyroxine (SYNTHROID, LEVOTHROID) 150 MCG tablet.  [id number] [place] [place] - [address] [phone] (Phone) [phone] (Fax)

## 2016-07-04 ENCOUNTER — Other Ambulatory Visit: Payer: BLUE CROSS/BLUE SHIELD

## 2016-09-18 ENCOUNTER — Encounter: Payer: Self-pay | Admitting: Internal Medicine

## 2016-09-18 ENCOUNTER — Ambulatory Visit (INDEPENDENT_AMBULATORY_CARE_PROVIDER_SITE_OTHER): Payer: BLUE CROSS/BLUE SHIELD | Admitting: Internal Medicine

## 2016-09-18 VITALS — BP 126/74 | HR 67 | Ht 69.0 in | Wt 304.0 lb

## 2016-09-18 DIAGNOSIS — E890 Postprocedural hypothyroidism: Secondary | ICD-10-CM

## 2016-09-18 DIAGNOSIS — D500 Iron deficiency anemia secondary to blood loss (chronic): Secondary | ICD-10-CM | POA: Diagnosis not present

## 2016-09-18 LAB — CBC
HCT: 27.1 % — ABNORMAL LOW (ref 36.0–46.0)
Hemoglobin: 8.3 g/dL — ABNORMAL LOW (ref 12.0–15.0)
MCHC: 30.7 g/dL (ref 30.0–36.0)
MCV: 66.6 fl — AB (ref 78.0–100.0)
PLATELETS: 436 10*3/uL — AB (ref 150.0–400.0)
RBC: 4.07 Mil/uL (ref 3.87–5.11)
RDW: 19.7 % — ABNORMAL HIGH (ref 11.5–15.5)
WBC: 6.4 10*3/uL (ref 4.0–10.5)

## 2016-09-18 LAB — TSH: TSH: 2.41 u[IU]/mL (ref 0.35–4.50)

## 2016-09-18 LAB — T4, FREE: FREE T4: 1.21 ng/dL (ref 0.60–1.60)

## 2016-09-18 NOTE — Progress Notes (Signed)
Patient ID: Diamond [name], female   DOB: 11/15/1985, 30 y.o.   MRN: [id number]   HPI  Diamond [name] is a 30 y.o.-year-old female, returning for f/u for postsurgical hypothyroidism after thyroidectomy for MNG. Last visit 1 years ago.  She has occasional CP - has a h/o anemia, last Hb was 9.0 in 02/2016. She sees ObGyn - last visit > 1 year.  Has a h/o anemia >> was on iron in the past >> now off for > 2 years.  Reviewed hx: Patient was diagnosed with enlarged thyroid approximately 1 year ago - at [facility]. She had a thyroid ultrasound that showed 3 thyroid nodules. The 2 largest nodules were biopsied >> benign (10/2012).     She was describing a "lump" mid neck - for 2 years, + hoarseness for 2 years, + dysphagia - meats but also swallowing saliva laying down/no odynophagia, + SOB with lying down.  A Barium swallow showed mild impression from thyroid on Esophagus >> I referred her to surgery >> had total thyroidectomy 10/08/2013 >> final pathology shows nodular hyperplasia. Postoperative serum calcium level was normal at 9.6. She was started on Synthroid 100 mcg daily. Last visit with Dr Gerrit FriendsGerkin was on 12/13/2013.  I reviewed pt's thyroid latest tests: Lab Results  Component Value Date   TSH 2.58 01/11/2016   TSH 8.87 (H) 09/19/2015   TSH 3.67 07/28/2015   TSH 9.45 (H) 01/06/2015   TSH 8.18 (H) 09/12/2014   TSH 3.27 05/27/2014   TSH 5.61 (H) 03/22/2014   TSH 12.06 (H) 11/18/2013   TSH 1.087 07/12/2013   FREET4 1.23 01/11/2016   FREET4 0.98 09/19/2015   FREET4 1.16 07/28/2015   FREET4 0.99 01/06/2015   FREET4 0.93 09/12/2014   FREET4 1.01 05/27/2014   FREET4 0.92 03/22/2014   FREET4 0.91 11/18/2013   FREET4 1.27 07/12/2013   She takes 150 mcg Synthroid in am, fasting, with water. She was taking iron in the afternoon, now off. No Ca, MVI, PPI.  She feels good after the surgery, no dysphagia.   She has: - no heat intolerance/cold intolerance - no tremors - no  anxiety/depression - no palpitations - no problems with concentration - no fatigue - no hyperdefecation/constipation - no weight loss/weight gain - no dry skin - no hair loss   Pt also has a history of IDA 3 years ago.  ROS: Constitutional: see HPI Eyes: no blurry vision, no xerophthalmia ENT: no sore throat, no nodules palpated in throat, no dysphagia/no odynophagia, no hoarseness Cardiovascular: no CP/SOB/palpitations/leg swelling Respiratory: no cough/SOB Gastrointestinal: no N/V/D/C Musculoskeletal: no muscle/joint aches Skin: no rashes Neurological: no tremors/numbness/tingling/dizziness  I reviewed pt's medications, allergies, PMH, social hx, family hx, and changes were documented in the history of present illness. Otherwise, unchanged from my initial visit note.  PE: BP 126/74   Pulse 67   Ht 5\' 9"  (1.753 m)   Wt (!) 304 lb (137.9 kg)   SpO2 98%   BMI 44.89 kg/m  Body mass index is 44.89 kg/m.  Wt Readings from Last 3 Encounters:  09/18/16 (!) 304 lb (137.9 kg)  09/19/15 (!) 313 lb 12.8 oz (142.3 kg)  03/22/14 (!) 317 lb (143.8 kg)   Constitutional: obese, in NAD Eyes: PERRLA, EOMI, + bilat. exophthalmos  ENT: moist mucous membranes, cervical scar healing well, no cervical lymphadenopathy Cardiovascular: RRR, No MRG Respiratory: CTA B Gastrointestinal: abdomen soft, NT, ND, BS+ Musculoskeletal: no deformities, strength intact in all 4;  Skin: moist, warm, no rashes  ASSESSMENT:  1. MNG  - [facility] 09/09/2012:  Right lobe is 7.3 x 2.5 x 2.2 cm. On the right, there is a solid 2.2 x 1.2 x 1.6 cm mass. The second mass measures 1.0 x 1.2 x 1.2 cm. A third mass measures 1.6 x 1.1 x 1.3 cm. There are several subcentimeter masses on the right as well.  Left lobe is 8.0 x 3.8 x 2.8 cm There is a mass in the left lobe measuring 5.0 x 4.9 x 3.9 cm, with few tiny calcifications.  Isthmus is 8 mm in thickness.  In the isthmus, there  is a mass measuring 1.8 x 1.7 x 1.4 cm all masses are solid. Enlarged thyroid with multiple solid masses.  - thyroid FNA (10/2012):   left sided 4 cm nodule: benign, with Hurthle cell and cystic changes  right-sided 2 cm nodule: benign, with Hurthle cell and cystic changes  - Ba swallow 08/27/2013: Slight impression on the anterior lower cervical esophagus, possibly related to the patient's known multinodular goiter. Brief sticking of the 13 mm barium tablet in the cervical esophagus.  - total thyroidectomy 10/08/2013 >> benign path  2. IDA  Plan: 1. MNG  - Pt has a h/o thyroidectomy in 10/2013 2/2 dysphagia and hoarseness and a Ba Es showing thyroid compressing the Es >> she had total thyroidectomy. - We discussed to take the thyroid hormone every day, with water, >30 minutes before breakfast, separated by >4 hours from acid reflux medications, calcium, iron, multivitamins. She is taking it correctly now. - she continues LT4 150 mcg daily - feels good on this dose - will check TFTs today - I'll see her back in 1 year  - will need refills of LT4 when labs are back  2. IDA - she c/o CP but no SOB - will recheck Hb, last was 9 - 7 mo ago. - I recommended that she sees her ObGyn   Component     Latest Ref Rng & Units 09/18/2016          WBC     4.0 - 10.5 K/uL 6.4  RBC     3.87 - 5.11 Mil/uL 4.07  Hemoglobin     12.0 - 15.0 g/dL 8.3 Repeated and verified X2. (L)  HCT     36.0 - 46.0 % 27.1 (L)  MCV     78.0 - 100.0 fl 66.6 (L)  MCH     26.0 - 34.0 pg   MCHC     30.0 - 36.0 g/dL 78.230.7  RDW     95.611.5 - 21.315.5 % 19.7 (H)  Platelets     150.0 - 400.0 K/uL 436.0 (H)  TSH     0.35 - 4.50 uIU/mL 2.41  T4,Free(Direct)     0.60 - 1.60 ng/dL 0.861.21   Thyroid tests are normal, however, her hemoglobin is lower. I would suggest that she started her iron but also go back to see her OB/GYN doctor.  Carlus Pavlovristina Seymour Pavlak, MD PhD [facility]

## 2016-09-18 NOTE — Patient Instructions (Addendum)
Please stop at the lab.  Please continue Levothyroxine 150 mcg daily.  Take the thyroid hormone every day, with water, >30 minutes before breakfast, separated by >4 hours from acid reflux medications, calcium, iron, multivitamins.  Please return in 1 year.   

## 2016-09-19 DIAGNOSIS — D509 Iron deficiency anemia, unspecified: Secondary | ICD-10-CM | POA: Insufficient documentation

## 2016-09-19 HISTORY — DX: Iron deficiency anemia, unspecified: D50.9

## 2016-09-19 MED ORDER — LEVOTHYROXINE SODIUM 150 MCG PO TABS: ORAL_TABLET | ORAL | 3 refills | Status: DC

## 2017-01-22 ENCOUNTER — Encounter (HOSPITAL_COMMUNITY): Payer: Self-pay

## 2017-01-22 ENCOUNTER — Emergency Department (HOSPITAL_COMMUNITY)
Admission: EM | Admit: 2017-01-22 | Discharge: 2017-01-22 | Disposition: A | Discharge status: Home / Self Care | Payer: BLUE CROSS/BLUE SHIELD | Attending: Emergency Medicine | Admitting: Emergency Medicine

## 2017-01-22 DIAGNOSIS — E039 Hypothyroidism, unspecified: Secondary | ICD-10-CM | POA: Insufficient documentation

## 2017-01-22 DIAGNOSIS — L0291 Cutaneous abscess, unspecified: Secondary | ICD-10-CM

## 2017-01-22 DIAGNOSIS — L02416 Cutaneous abscess of left lower limb: Secondary | ICD-10-CM | POA: Insufficient documentation

## 2017-01-22 MED ORDER — LIDOCAINE HCL 2 % IJ SOLN
20.0000 mL | Freq: Once | INTRAMUSCULAR | Status: AC
  Administered 2017-01-22: 400 mg
  Filled 2017-01-22: qty 20

## 2017-01-22 MED ORDER — SULFAMETHOXAZOLE-TRIMETHOPRIM 800-160 MG PO TABS: 1.0000 | ORAL_TABLET | Freq: Two times a day (BID) | ORAL | 0 refills | Status: AC

## 2017-01-22 NOTE — ED Triage Notes (Signed)
PT C/O AN ABSCESS TO THE TOP OF THE LEFT THIGH X1 1/2 WEEKS. DENIES FEVER OR DRAINAGE.

## 2017-01-22 NOTE — Discharge Instructions (Signed)
Please read attached information. If you experience any new or worsening signs or symptoms please return to the emergency room for evaluation. Please follow-up with your primary care provider or specialist as discussed. Please use medication prescribed only as directed and discontinue taking if you have any concerning signs or symptoms.   °

## 2017-01-22 NOTE — ED Notes (Signed)
Patient was alert, oriented and stable upon discharge. RN went over AVS and patient had no further questions.  

## 2017-01-22 NOTE — ED Provider Notes (Signed)
[facility]-EMERGENCY DEPT Provider Note      History   Chief Complaint Chief Complaint  Patient presents with  . Abscess    LEFT THIGH   The history is provided by the patient and medical records. No language interpreter was used.    31 year old female presents today with an abscess to her left upper thigh.  She notes this started approximately 1 week ago and has worsened.  She notes a history of the same required incision and drainage.  She denies any fever chills nausea or vomiting, no other complaints.  No medications prior to arrival.  Past Medical History:  Diagnosis Date  . Anemia   . History of chicken pox   . Multiple thyroid nodules 04/2013   3 nodules per pt  . Pneumonia    hx of as child    Patient Active Problem List   Diagnosis Date Noted  . Iron deficiency anemia 09/19/2016  . 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 Routine general medical examination at a health care facility 08/23/2013  . Cervical lesion 08/23/2013    Past Surgical History:  Procedure Laterality Date  . APPENDECTOMY     "teenager"  . FRACTURE SURGERY Right    "compound fracture of leg per pt"  . HARDWARE REMOVAL     a month after fracture surgery  . THYROIDECTOMY N/A 10/08/2013   Procedure: TOTAL THYROIDECTOMY;  Surgeon: Velora Hecklerodd M Gerkin, MD;  Location: [facility] ORS;  Service: General;  Laterality: N/A;  . TONSILLECTOMY AND ADENOIDECTOMY  2012    OB History    No data available       Home Medications    Prior to Admission medications   Medication Sig Start Date End Date Taking? Authorizing Provider  levothyroxine (SYNTHROID, LEVOTHROID) 150 MCG tablet TAKE 1 TABLET (150 MCG TOTAL) BY MOUTH DAILY. 09/19/16   Carlus Pavlovristina Gherghe, MD  sulfamethoxazole-trimethoprim (BACTRIM DS,SEPTRA DS) 800-160 MG tablet Take 1 tablet by mouth 2 (two) times daily. 01/22/17 01/29/17  Eyvonne MechanicJeffrey Rozetta Stumpp, PA-C    Family History Family History  Problem Relation Age of Onset  . Thyroid disease Mother     " had  thyroid removed due to nodules per pt"  . Hyperlipidemia Father   . Hypertension Father   . Diabetes Father   . Cancer Paternal Grandmother     lung  . Hyperlipidemia Paternal Grandmother   . Hypertension Paternal Grandmother   . Diabetes Paternal Grandmother     Social History Social History  Substance Use Topics  . Smoking status: Never Smoker  . Smokeless tobacco: Never Used  . Alcohol use 0.0 oz/week     Comment: occasional     Allergies   Penicillins   Review of Systems Review of Systems  Constitutional: Negative for fever.   A complete 10 system review of systems was obtained and all systems are negative except as noted in the HPI and PMH.    Physical Exam Updated Vital Signs BP (!) 147/63 (BP Location: Right Arm)   Pulse 83   Temp 98.1 F (36.7 C) (Oral)   Resp 20   Ht 5\' 8"  (1.727 m)   Wt 300 lb (136.1 kg)   LMP 01/08/2017   SpO2 98%   BMI 45.61 kg/m   Physical Exam  Constitutional: She is oriented to person, place, and time. She appears well-developed and well-nourished.  HENT:  Head: Normocephalic and atraumatic.  Neck: Normal range of motion.  Cardiovascular: Normal rate.   Pulmonary/Chest: Effort normal.  Musculoskeletal: Normal range  of motion.  2 cm abscess to left upper thigh, small amount of overriding cellulitis noted.  Central fluctuance noted  Neurological: She is alert and oriented to person, place, and time.  Skin: Skin is warm and dry.  Psychiatric: She has a normal mood and affect. Her behavior is normal.  Nursing note and vitals reviewed.    ED Treatments / Results  DIAGNOSTIC STUDIES: Oxygen Saturation is 98% on RA, normal by my interpretation.     Medications  lidocaine (XYLOCAINE) 2 % (with pres) injection 400 mg (400 mg Infiltration Given by Other 01/22/17 1431)    Labs (all labs ordered are listed, but only abnormal results are displayed) Labs Reviewed - No data to display  EKG  EKG Interpretation None        Radiology No results found.  Procedures .Marland KitchenIncision and Drainage Date/Time: 01/22/2017 3:32 PM Performed by: Curlene Dolphin, Jermika Olden Authorized by: Curlene Dolphin, Elesa Garman   Consent:    Consent obtained:  Verbal   Consent given by:  Patient   Risks discussed:  Bleeding, incomplete drainage, infection and pain   Alternatives discussed:  No treatment and delayed treatment Location:    Type:  Abscess   Location:  Lower extremity Pre-procedure details:    Skin preparation:  Betadine Anesthesia (see MAR for exact dosages):    Anesthesia method:  Local infiltration   Local anesthetic:  Lidocaine 2% w/o epi Procedure type:    Complexity:  Simple Procedure details:    Needle aspiration: no     Incision types:  Single straight   Incision depth:  Dermal   Scalpel blade:  11   Wound management:  Probed and deloculated and irrigated with saline   Drainage:  Purulent   Drainage amount:  Moderate   Wound treatment:  Wound left open   Packing materials:  None Post-procedure details:    Patient tolerance of procedure:  Tolerated well, no immediate complications    Medications Ordered in ED Medications  lidocaine (XYLOCAINE) 2 % (with pres) injection 400 mg (400 mg Infiltration Given by Other 01/22/17 1431)     Initial Impression / Assessment and Plan / ED Course  I have reviewed the triage vital signs and the nursing notes.  Pertinent labs & imaging results that were available during my care of the patient were reviewed by me and considered in my medical decision making (see chart for details).     31 year old female presents today with an abscess to her left upper thigh.  She has no signs of surrounding infection.  I&D successfully here with purulent drainage.  Wound care instructions given, return precautions given.  Patient verbalized understanding and agreement to today's plan had no further questions or concerns.   Final Clinical Impressions(s) / ED Diagnoses   Final diagnoses:   Abscess   31 year old female presents today with abscess of her left thigh.  She has small amount of surrounding cellulitis.  Abscess was drained without complication.  She will be placed on antibiotics.  Wound care instructions given.  Strict return precautions given.  She verbalized understanding and agreement to today's plan had no further questions or concerns  New Prescriptions New Prescriptions   SULFAMETHOXAZOLE-TRIMETHOPRIM (BACTRIM DS,SEPTRA DS) 800-160 MG TABLET    Take 1 tablet by mouth 2 (two) times daily.     Eyvonne Mechanic, PA-C 01/22/17 1534    Gwyneth Sprout, MD 01/22/17 [id number]

## 2017-09-18 ENCOUNTER — Ambulatory Visit: Payer: BLUE CROSS/BLUE SHIELD | Admitting: Internal Medicine

## 2017-09-19 ENCOUNTER — Other Ambulatory Visit: Payer: Self-pay

## 2017-09-19 MED ORDER — LEVOTHYROXINE SODIUM 150 MCG PO TABS: ORAL_TABLET | ORAL | 3 refills | Status: DC

## 2017-09-22 ENCOUNTER — Telehealth: Payer: Self-pay | Admitting: Internal Medicine

## 2017-09-22 NOTE — Telephone Encounter (Signed)
Patient need a refill for levothyroxine (SYNTHROID, LEVOTHROID) 150 MCG tablet [[id number]]  [id number] - [place], [place] - [address]

## 2017-09-23 ENCOUNTER — Other Ambulatory Visit: Payer: Self-pay

## 2017-09-23 MED ORDER — LEVOTHYROXINE SODIUM 150 MCG PO TABS: ORAL_TABLET | ORAL | 3 refills | Status: DC

## 2017-09-23 NOTE — Telephone Encounter (Signed)
Submitted

## 2017-11-30 ENCOUNTER — Emergency Department (HOSPITAL_COMMUNITY)
Admission: EM | Admit: 2017-11-30 | Discharge: 2017-11-30 | Disposition: A | Discharge status: Home / Self Care | Payer: Managed Care, Other (non HMO) | Attending: Emergency Medicine | Admitting: Emergency Medicine

## 2017-11-30 ENCOUNTER — Encounter (HOSPITAL_COMMUNITY): Payer: Self-pay | Admitting: Emergency Medicine

## 2017-11-30 DIAGNOSIS — L0291 Cutaneous abscess, unspecified: Secondary | ICD-10-CM

## 2017-11-30 DIAGNOSIS — L02416 Cutaneous abscess of left lower limb: Secondary | ICD-10-CM | POA: Diagnosis not present

## 2017-11-30 DIAGNOSIS — Z23 Encounter for immunization: Secondary | ICD-10-CM | POA: Diagnosis not present

## 2017-11-30 DIAGNOSIS — Z79899 Other long term (current) drug therapy: Secondary | ICD-10-CM | POA: Insufficient documentation

## 2017-11-30 MED ORDER — LIDOCAINE-EPINEPHRINE (PF) 2 %-1:200000 IJ SOLN
10.0000 mL | Freq: Once | INTRAMUSCULAR | Status: AC
  Administered 2017-11-30: 10 mL
  Filled 2017-11-30: qty 20

## 2017-11-30 MED ORDER — TETANUS-DIPHTH-ACELL PERTUSSIS 5-2.5-18.5 LF-MCG/0.5 IM SUSP
0.5000 mL | Freq: Once | INTRAMUSCULAR | Status: AC
  Administered 2017-11-30: 0.5 mL via INTRAMUSCULAR
  Filled 2017-11-30: qty 0.5

## 2017-11-30 NOTE — ED Triage Notes (Signed)
Patient here from home with complaints of left leg abscess that started Thursday. Patient reports that abscess has opened and started draining. Denies nausea, vomiting.

## 2017-11-30 NOTE — ED Provider Notes (Signed)
[facility]-EMERGENCY DEPT Provider Note   CSN: [id number] Arrival date & time: 11/30/17  1659     History   Chief Complaint Chief Complaint  Patient presents with  . Abscess    HPI Diamond [name] is a 32 y.o. female past medical history significant for recurrent abscess, presenting with abscess to the left anterior thigh.  She reports that she noticed the area 2 days ago was about the size of a penny and yesterday got progressively larger and started draining purulent discharge today.  She denies any fever, chills, nausea, vomiting or other symptoms.  HPI  Past Medical History:  Diagnosis Date  . Anemia   . History of chicken pox   . Multiple thyroid nodules 04/2013   3 nodules per pt  . Pneumonia    hx of as child    Patient Active Problem List   Diagnosis Date Noted  . Iron deficiency anemia 09/19/2016  . 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 Routine general medical examination at a health care facility 08/23/2013  . Cervical lesion 08/23/2013    Past Surgical History:  Procedure Laterality Date  . APPENDECTOMY     "teenager"  . FRACTURE SURGERY Right    "compound fracture of leg per pt"  . HARDWARE REMOVAL     a month after fracture surgery  . THYROIDECTOMY N/A 10/08/2013   Procedure: TOTAL THYROIDECTOMY;  Surgeon: Velora Hecklerodd M Gerkin, MD;  Location: [facility] ORS;  Service: General;  Laterality: N/A;  . TONSILLECTOMY AND ADENOIDECTOMY  2012    OB History    No data available       Home Medications    Prior to Admission medications   Medication Sig Start Date End Date Taking? Authorizing Provider  levothyroxine (SYNTHROID, LEVOTHROID) 150 MCG tablet TAKE 1 TABLET (150 MCG TOTAL) BY MOUTH DAILY. 09/23/17   Carlus PavlovGherghe, Cristina, MD    Family History Family History  Problem Relation Age of Onset  . Thyroid disease Mother        " had thyroid removed due to nodules per pt"  . Hyperlipidemia Father   . Hypertension Father   . Diabetes  Father   . Cancer Paternal Grandmother        lung  . Hyperlipidemia Paternal Grandmother   . Hypertension Paternal Grandmother   . Diabetes Paternal Grandmother     Social History Social History   Tobacco Use  . Smoking status: Never Smoker  . Smokeless tobacco: Never Used  Substance Use Topics  . Alcohol use: Yes    Alcohol/week: 0.0 oz    Comment: occasional  . Drug use: No     Allergies   Penicillins   Review of Systems Review of Systems  Constitutional: Negative for chills, diaphoresis and fever.  Respiratory: Negative for shortness of breath, wheezing and stridor.   Cardiovascular: Negative for chest pain.  Gastrointestinal: Negative for nausea and vomiting.  Musculoskeletal: Negative for myalgias, neck pain and neck stiffness.  Skin: Positive for color change and wound. Negative for pallor and rash.       Draining abscess left anterior thigh  Neurological: Negative for dizziness, light-headedness and headaches.     Physical Exam Updated Vital Signs BP (!) 198/109 (BP Location: Right Arm)   Pulse 86   Temp 98.1 F (36.7 C) (Oral)   Resp 18   SpO2 100%   Physical Exam  Constitutional: She appears well-developed and well-nourished. No distress.  Afebrile, nontoxic-appearing, sitting comfortably in chair in no acute distress.  HENT:  Head: Normocephalic and atraumatic.  Eyes: Conjunctivae and EOM are normal.  Neck: Normal range of motion.  Cardiovascular: Normal rate, regular rhythm and normal heart sounds.  No murmur heard. Pulmonary/Chest: Effort normal and breath sounds normal. No stridor. No respiratory distress. She has no wheezes.  Abdominal: She exhibits no distension.  Musculoskeletal: Normal range of motion. She exhibits no edema.  Neurological: She is alert.  Skin: Skin is warm and dry. No rash noted. She is not diaphoretic. There is erythema. No pallor.  Purulent discharge draining from punctate opening in 1.5 x 1.5 cm abscess to the left  anterior thigh  Psychiatric: She has a normal mood and affect.  Nursing note and vitals reviewed.    ED Treatments / Results  Labs (all labs ordered are listed, but only abnormal results are displayed) Labs Reviewed - No data to display  EKG  EKG Interpretation None       Radiology No results found.  Procedures Procedures (including critical care time) INCISION AND DRAINAGE Performed by: Georgiana Shore Consent: Verbal consent obtained. Risks and benefits: risks, benefits and alternatives were discussed Type: abscess  Body area: left anterior thigh  Anesthesia: local infiltration  Incision was made with a scalpel.  Local anesthetic: lidocaine 2% with epinephrine  Anesthetic total: 5 ml  Complexity: complex Blunt dissection to break up loculations  Drainage: purulent  Drainage amount: 4mL  Packing material: 1/4 in iodoform gauze  Patient tolerance: Patient tolerated the procedure well with no immediate complications.   Medications Ordered in ED Medications  Tdap (BOOSTRIX) injection 0.5 mL (0.5 mLs Intramuscular Given 11/30/17 2016)  lidocaine-EPINEPHrine (XYLOCAINE W/EPI) 2 %-1:200000 (PF) injection 10 mL (10 mLs Infiltration Given by Other 11/30/17 2016)     Initial Impression / Assessment and Plan / ED Course  I have reviewed the triage vital signs and the nursing notes.  Pertinent labs & imaging results that were available during my care of the patient were reviewed by me and considered in my medical decision making (see chart for details).     Patient with skin abscess amenable to incision and drainage.  Abscess was large enough to warrant packing, wound recheck in 2 days. Encouraged home warm soaks and flushing.  Mild signs of cellulitis is surrounding skin.  Will d/c to home.  No antibiotic therapy is indicated. tdap updated.  Urged patient to follow up with her primary care provider. Patient 's blood pressure was elevated on readings which  may have been due to pain and procedure.  Patient reported that she had a normal blood pressure reading a week ago and it always happens when she gets nervous like this.  She will be following up with her PCP regarding blood pressure.  DC home with close follow-up.  Discussed strict return precautions and advised to return to the emergency department if experiencing any new or worsening symptoms. Instructions were understood and patient agreed with discharge plan.  Final Clinical Impressions(s) / ED Diagnoses   Final diagnoses:  Abscess    ED Discharge Orders    None       Gregary Cromer 11/30/17 2215    Raeford Razor, MD 11/30/17 2252

## 2017-11-30 NOTE — Discharge Instructions (Signed)
As discussed, keep the area clean and dry and monitor for any worsening.  Have the wound rechecked in the next 2 days following up with your primary care provider, urgent care or emergency department.  Return sooner if redness, swelling, purulence, fever, chills or other new concerning symptoms in the meantime.

## 2017-12-02 ENCOUNTER — Emergency Department (HOSPITAL_COMMUNITY)
Admission: EM | Admit: 2017-12-02 | Discharge: 2017-12-02 | Disposition: A | Discharge status: Home / Self Care | Payer: Managed Care, Other (non HMO) | Attending: Emergency Medicine | Admitting: Emergency Medicine

## 2017-12-02 ENCOUNTER — Encounter (HOSPITAL_COMMUNITY): Payer: Self-pay | Admitting: *Deleted

## 2017-12-02 DIAGNOSIS — R030 Elevated blood-pressure reading, without diagnosis of hypertension: Secondary | ICD-10-CM | POA: Insufficient documentation

## 2017-12-02 DIAGNOSIS — E039 Hypothyroidism, unspecified: Secondary | ICD-10-CM | POA: Diagnosis not present

## 2017-12-02 DIAGNOSIS — Z5189 Encounter for other specified aftercare: Secondary | ICD-10-CM

## 2017-12-02 DIAGNOSIS — Z79899 Other long term (current) drug therapy: Secondary | ICD-10-CM | POA: Diagnosis not present

## 2017-12-02 DIAGNOSIS — L02416 Cutaneous abscess of left lower limb: Secondary | ICD-10-CM | POA: Diagnosis present

## 2017-12-02 MED ORDER — DOXYCYCLINE HYCLATE 100 MG PO TABS
100.0000 mg | ORAL_TABLET | Freq: Once | ORAL | Status: AC
  Administered 2017-12-02: 100 mg via ORAL
  Filled 2017-12-02: qty 1

## 2017-12-02 MED ORDER — DOXYCYCLINE HYCLATE 100 MG PO CAPS: 100.0000 mg | ORAL_CAPSULE | Freq: Two times a day (BID) | ORAL | 0 refills | Status: AC

## 2017-12-02 NOTE — ED Triage Notes (Signed)
Pt states she had abscess drained 2 days ago that has been bleeding and became more painful today at work. Pt was supposed to have the packing removed today

## 2017-12-02 NOTE — Discharge Instructions (Signed)
Please see the information and instructions below regarding your visit.  Your diagnoses today include:  1. Wound check, abscess   2. Elevated blood pressure reading     Abscesses form when an infection in your skin starts to collect bacteria and white blood cells, walling it off from the rest of your body to protect you from a bigger infection. Risk factors for this type of infection include:  ?Break in the skin ?Diabetes ?Swollen areas  Sometimes the infection starts to spread to surrounding tissue, causing redness and swelling. We call this cellulitis.   Tests performed today include: See side panel of your discharge paperwork for testing performed today. Vital signs are listed at the bottom of these instructions.   Medications prescribed:    Take any prescribed medications only as prescribed, and any over the counter medications only as directed on the packaging.  1. Please take all of your antibiotics until finished.   Doxycycline as an antibiotic.  He can make you sensitive to the sun, so please ensure that you are wearing sunscreen to forget to be on sun for long periods of time.  This medicine should also not be taken if pregnant or try to become pregnant, or breast-feeding.  You may develop abdominal discomfort or nausea from the antibiotic. If this occurs, you may take it with food. Some patients also get diarrhea with antibiotics. You may help offset this with probiotics which you can buy or get in yogurt. Do not eat or take the probiotics until 2 hours after your antibiotic. Some women develop vaginal yeast infections after antibiotics. If you develop unusual vaginal discharge after being on this medication, please see your primary care provider.   Some people develop allergies to antibiotics. Symptoms of antibiotic allergy can be mild and include a flat rash and itching. They can also be more serious and include:  ?Hives - Hives are raised, red patches of skin that are usually  very itchy.  ?Lip or tongue swelling  ?Trouble swallowing or breathing  ?Blistering of the skin or mouth.  If you have any of these serious symptoms, please seek emergency medical care immediately.  Home care instructions:  Please follow any educational materials contained in this packet.   Some things that may promote healing of your wound and infection include:  Raise your arm or leg to reduce swelling - Raise the arm or leg up above the level of your heart 3 or 4 times a day, for 30 minutes each time. Keep the infected area clean and dry. You can take a shower or bath, but be sure to pat the area dry with a towel afterward. Do not put any antibiotic ointments or creams on the area. Reapply a dry gauze dressing any time the bandage has become soaked with drainage, or after cleansing the wound.  Apply warm compresses to the wound 3-4 times daily to encourage drainage.   Return instructions:  Please return to the Emergency Department if you experience worsening symptoms. You should return for reevaluation of your infection if you notice spreading redness, increased swelling, an abscess develops, or you develop signs and symptoms of a systemic illness such as fever and chills.  Please return if you have any other emergent concerns.  Additional Information:   Your vital signs today were: BP (!) 154/107 (BP Location: Right Arm)    Pulse 81    Temp 98.4 F (36.9 C) (Oral)    Resp 18    LMP 11/18/2017  If your blood pressure (BP) was elevated on multiple readings during this visit above 130 for the top number or above 80 for the bottom number, please have this repeated by your primary care provider within one month. --------------  Thank you for allowing us to participate in your care today.

## 2017-12-02 NOTE — ED Provider Notes (Signed)
[facility]-EMERGENCY DEPT Provider Note   CSN: [id number] Arrival date & time: 12/02/17  1634     History   Chief Complaint Chief Complaint  Patient presents with  . Wound Check    HPI Diamond [name] is a 32 y.o. female.  HPI   Patient presents for wound check 48 hours after I&D.  Patient reports that she has had increasing bloody drainage, as well as some increasing erythema around the site of her left inner thigh.  Patient but she has not been performing warm compresses.  Patient has noticed purulent drainage coming out of the week of the packing.  Patient denies taking any antibiotics.  Past Medical History:  Diagnosis Date  . Anemia   . History of chicken pox   . Multiple thyroid nodules 04/2013   3 nodules per pt  . Pneumonia    hx of as child    Patient Active Problem List   Diagnosis Date Noted  . Iron deficiency anemia 09/19/2016  . 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 Routine general medical examination at a health care facility 08/23/2013  . Cervical lesion 08/23/2013    Past Surgical History:  Procedure Laterality Date  . APPENDECTOMY     "teenager"  . FRACTURE SURGERY Right    "compound fracture of leg per pt"  . HARDWARE REMOVAL     a month after fracture surgery  . THYROIDECTOMY N/A 10/08/2013   Procedure: TOTAL THYROIDECTOMY;  Surgeon: Velora Hecklerodd M Gerkin, MD;  Location: [facility];  Service: General;  Laterality: N/A;  . TONSILLECTOMY AND ADENOIDECTOMY  2012    OB History    No data available       Home Medications    Prior to Admission medications   Medication Sig Start Date End Date Taking? Authorizing Provider  levothyroxine (SYNTHROID, LEVOTHROID) 150 MCG tablet TAKE 1 TABLET (150 MCG TOTAL) BY MOUTH DAILY. 09/23/17   Carlus PavlovGherghe, Cristina, MD    Family History Family History  Problem Relation Age of Onset  . Thyroid disease Mother        " had thyroid removed due to nodules per pt"  . Hyperlipidemia Father     . Hypertension Father   . Diabetes Father   . Cancer Paternal Grandmother        lung  . Hyperlipidemia Paternal Grandmother   . Hypertension Paternal Grandmother   . Diabetes Paternal Grandmother     Social History Social History   Tobacco Use  . Smoking status: Never Smoker  . Smokeless tobacco: Never Used  Substance Use Topics  . Alcohol use: Yes    Alcohol/week: 0.0 oz    Comment: occasional  . Drug use: No     Allergies   Penicillins   Review of Systems Review of Systems  Constitutional: Negative for chills and fever.  Gastrointestinal: Negative for abdominal pain, nausea and vomiting.  Skin: Positive for color change.     Physical Exam Updated Vital Signs BP (!) 154/107 (BP Location: Right Arm)   Pulse 81   Temp 98.4 F (36.9 C) (Oral)   Resp 18   LMP 11/18/2017   Physical Exam  Constitutional: She appears well-developed and well-nourished. No distress.  Sitting comfortably in bed.  HENT:  Head: Normocephalic and atraumatic.  Eyes: Conjunctivae are normal. Right eye exhibits no discharge. Left eye exhibits no discharge.  EOMs normal to gross examination.  Neck: Normal range of motion.  Pulmonary/Chest:  Normal respiratory effort. Patient converses comfortably. No audible wheeze  or stridor.  Abdominal: She exhibits no distension.  Musculoskeletal: Normal range of motion.  Neurological: She is alert.  Cranial nerves intact to gross observation. Patient moves extremities without difficulty.  Skin: Skin is warm and dry. She is not diaphoretic.  Examination of draining left groin abscess assessed with nurse tech chaperone present.  There is an actively draining area of induration with packing in place in the left medial thigh.  The drainage is purulent and sanguinous.  There is a mild amount of erythema extending in a bandlike pattern from the wound into medial thigh.  Psychiatric: She has a normal mood and affect. Her behavior is normal. Judgment and  thought content normal.  Nursing note and vitals reviewed.    ED Treatments / Results  Labs (all labs ordered are listed, but only abnormal results are displayed) Labs Reviewed - No data to display  EKG  EKG Interpretation None       Radiology No results found.  Procedures Wound packing Date/Time: 12/03/2017 12:23 AM Performed by: Elisha Ponder, PA-C Authorized by: Elisha Ponder, PA-C  Consent: Verbal consent obtained. Consent given by: patient Patient understanding: patient states understanding of the procedure being performed Patient identity confirmed: verbally with patient Preparation: Patient was prepped and draped in the usual sterile fashion (Betadine applied. ). Patient tolerance: Patient tolerated the procedure well with no immediate complications    (including critical care time)  Medications Ordered in ED Medications - No data to display   Initial Impression / Assessment and Plan / ED Course  I have reviewed the triage vital signs and the nursing notes.  Pertinent labs & imaging results that were available during my care of the patient were reviewed by me and considered in my medical decision making (see chart for details).     Final Clinical Impressions(s) / ED Diagnoses   Final diagnoses:  Elevated blood pressure reading  Wound check, abscess   Patient is nontoxic-appearing, afebrile, and in no acute distress.  Abscess cavity appears to be draining, however given the depth of it, additional packing placed.  No further marsupialization required.  I also encouraged patient to apply warm compresses, as she has not been performing these.  Given that there is some development of erythema, will place patient on antibiotics.  Instructed patient to follow-up in 48 hours for a wound recheck and packing removal.  Return precautions given for any increasing erythema, redeveloping of abscess, or fever or chills.  Patient is in understanding and agrees with  the plan of care.  I discussed with patient that I would like her to have a negative pregnancy test before prescribing doxycycline.  Patient deferred this, reporting that she knows she cannot be pregnant due to having a recent pregnancy test at her OB/GYN.  I discussed the risks and benefits of this with the patient, and patient declined taking the pregnancy test.  Noted to patient that blood pressure is elevated today.  I encouraged patient to follow-up with her primary care provider regarding this.  ED Discharge Orders    None       Delia Chimes 12/03/17 0023    Vanetta Mulders, MD 12/03/17 [phone]

## 2017-12-05 ENCOUNTER — Emergency Department (HOSPITAL_COMMUNITY)
Admission: EM | Admit: 2017-12-05 | Discharge: 2017-12-05 | Disposition: A | Discharge status: Home / Self Care | Payer: Managed Care, Other (non HMO) | Attending: Emergency Medicine | Admitting: Emergency Medicine

## 2017-12-05 ENCOUNTER — Encounter (HOSPITAL_COMMUNITY): Payer: Self-pay | Admitting: Emergency Medicine

## 2017-12-05 ENCOUNTER — Other Ambulatory Visit: Payer: Self-pay

## 2017-12-05 DIAGNOSIS — Z48817 Encounter for surgical aftercare following surgery on the skin and subcutaneous tissue: Secondary | ICD-10-CM | POA: Insufficient documentation

## 2017-12-05 DIAGNOSIS — Z5189 Encounter for other specified aftercare: Secondary | ICD-10-CM

## 2017-12-05 NOTE — Discharge Instructions (Signed)
Area appears to be healing well and continuing to improve.  Please continue with twice daily warm compresses and take your full course of antibiotics even if symptoms completely resolved.  If you follow-up redness around the area, worsening pain, increased drainage or develop fevers or chills please return to the ED for reevaluation otherwise please follow-up with your primary care doctor next week.

## 2017-12-05 NOTE — ED Notes (Signed)
Bed: WTR6 Expected date:  Expected time:  Means of arrival:  Comments: 

## 2017-12-05 NOTE — ED Notes (Signed)
ED Provider at bedside. 

## 2017-12-05 NOTE — ED Provider Notes (Signed)
[facility]-EMERGENCY DEPT Provider Note   CSN: [id number] Arrival date & time: 12/05/17  7829     History   Chief Complaint Chief Complaint  Patient presents with  . Wound Check    HPI Diamond [name] is a 32 y.o. female.  Diamond [name] is a 32 y.o. Female who presents for wound check and packing removal.  Had incision and drainage of an cyst to the left anterior thigh on 1/27, was seen in the ED 2 days later for wound check and packing removal, patient was still having continued drainage at that time, with some increased erythema around it, wound was repacked and patient placed on antibiotics.  Patient reports vast improvement, she reports redness and swelling has reduced, and she was able to work all day yesterday without pain.  Patient reports much less drainage from the area.  She is been continuing to do warm compresses at least twice a day which she reports is helping.  She denies any fevers or chills.  No nausea vomiting.      Past Medical History:  Diagnosis Date  . Anemia   . History of chicken pox   . Multiple thyroid nodules 04/2013   3 nodules per pt  . Pneumonia    hx of as child    Patient Active Problem List   Diagnosis Date Noted  . Iron deficiency anemia 09/19/2016  . 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 Routine general medical examination at a health care facility 08/23/2013  . Cervical lesion 08/23/2013    Past Surgical History:  Procedure Laterality Date  . APPENDECTOMY     "teenager"  . FRACTURE SURGERY Right    "compound fracture of leg per pt"  . HARDWARE REMOVAL     a month after fracture surgery  . THYROIDECTOMY N/A 10/08/2013   Procedure: TOTAL THYROIDECTOMY;  Surgeon: Velora Heckler, MD;  Location: [facility];  Service: General;  Laterality: N/A;  . TONSILLECTOMY AND ADENOIDECTOMY  2012    OB History    No data available       Home Medications    Prior to Admission medications   Medication Sig  Start Date End Date Taking? Authorizing Provider  doxycycline (VIBRAMYCIN) 100 MG capsule Take 1 capsule (100 mg total) by mouth 2 (two) times daily for 7 days. 12/02/17 12/09/17  Aviva Kluver B, PA-C  levothyroxine (SYNTHROID, LEVOTHROID) 150 MCG tablet TAKE 1 TABLET (150 MCG TOTAL) BY MOUTH DAILY. 09/23/17   Carlus Pavlov, MD    Family History Family History  Problem Relation Age of Onset  . Thyroid disease Mother        " had thyroid removed due to nodules per pt"  . Hyperlipidemia Father   . Hypertension Father   . Diabetes Father   . Cancer Paternal Grandmother        lung  . Hyperlipidemia Paternal Grandmother   . Hypertension Paternal Grandmother   . Diabetes Paternal Grandmother     Social History Social History   Tobacco Use  . Smoking status: Never Smoker  . Smokeless tobacco: Never Used  Substance Use Topics  . Alcohol use: Yes    Alcohol/week: 0.0 oz    Comment: occasional  . Drug use: No     Allergies   Penicillins   Review of Systems Review of Systems  Constitutional: Negative for chills and fever.  Gastrointestinal: Negative for nausea and vomiting.  Skin: Positive for wound (Abscess s/p I&D). Negative for color change and rash.  Physical Exam Updated Vital Signs BP 133/87 (BP Location: Left Arm)   Pulse 83   Temp 98 F (36.7 C) (Oral)   Resp 18   LMP 11/18/2017   SpO2 100%   Physical Exam  Constitutional: She appears well-developed and well-nourished. No distress.  Sitting comfortably in exam room  HENT:  Head: Normocephalic and atraumatic.  Eyes: Right eye exhibits no discharge. Left eye exhibits no discharge.  Pulmonary/Chest: Effort normal. No respiratory distress.  Neurological: She is alert. Coordination normal.  Moves extremities without difficulty  Skin: Skin is warm and dry. Capillary refill takes less than 2 seconds. She is not diaphoretic.  Left groin abscess with packing present, no active drainage today.  Small area  of induration, but erythema seems to be improved when compared to previous note.  Psychiatric: She has a normal mood and affect. Her behavior is normal.  Nursing note and vitals reviewed.    ED Treatments / Results  Labs (all labs ordered are listed, but only abnormal results are displayed) Labs Reviewed - No data to display  EKG  EKG Interpretation None       Radiology No results found.  Procedures Procedures (including critical care time)  Medications Ordered in ED Medications - No data to display   Initial Impression / Assessment and Plan / ED Course  I have reviewed the triage vital signs and the nursing notes.  Pertinent labs & imaging results that were available during my care of the patient were reviewed by me and considered in my medical decision making (see chart for details).  Pt is well-appearing with normal vitals, presents for wound check  and packing removal of abscess after incision and drainage.  Patient reports vast improvement in redness, pain and a decrease in drainage since being placed on antibiotics 3 days ago.  No active drainage, erythema and induration appear to be improving since patient was placed on antibiotics.  Packing removed and wound probed, no further drainage or loculations present, I do not feel that additional packing is necessary at this time.  Will have patient complete regimen of antibiotics and continue with warm compresses twice daily.  Strict return precautions discussed.  Patient to follow-up with her primary care provider next week.  Patient expresses understanding and is in agreement with plan  Final Clinical Impressions(s) / ED Diagnoses   Final diagnoses:  Wound check, abscess    ED Discharge Orders    None       Legrand RamsFord, Brienna Bass N, PA-C 12/05/17 1129    Pricilla LovelessGoldston, Scott, MD 12/05/17 [phone]

## 2017-12-05 NOTE — ED Triage Notes (Signed)
Pt here to have packing removed from left groin. Denies complaint.

## 2017-12-08 ENCOUNTER — Ambulatory Visit (INDEPENDENT_AMBULATORY_CARE_PROVIDER_SITE_OTHER): Payer: BLUE CROSS/BLUE SHIELD | Admitting: Internal Medicine

## 2017-12-08 ENCOUNTER — Encounter: Payer: Self-pay | Admitting: Internal Medicine

## 2017-12-08 VITALS — BP 118/70 | HR 81 | Ht 68.25 in | Wt 312.0 lb

## 2017-12-08 DIAGNOSIS — Z9889 Other specified postprocedural states: Secondary | ICD-10-CM | POA: Insufficient documentation

## 2017-12-08 DIAGNOSIS — E890 Postprocedural hypothyroidism: Secondary | ICD-10-CM

## 2017-12-08 HISTORY — DX: Postprocedural hypothyroidism: E89.0

## 2017-12-08 LAB — TSH: TSH: 0.9 u[IU]/mL (ref 0.35–4.50)

## 2017-12-08 LAB — T4, FREE: FREE T4: 1.71 ng/dL — AB (ref 0.60–1.60)

## 2017-12-08 NOTE — Patient Instructions (Signed)
Please stop at the lab.  Please continue Levothyroxine 150 mcg daily.  Take the thyroid hormone every day, with water, >30 minutes before breakfast, separated by >4 hours from acid reflux medications, calcium, iron, multivitamins.  Please return in 1 year.   

## 2017-12-08 NOTE — Progress Notes (Signed)
Patient ID: Diamond RetortDominique [name], female   DOB: 02/03/1986, 32 y.o.   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 Last visit 1 year and 3 months ago years ago.  She was in the hospital with an inguinal abscess 1 weeks ago. On Doxycycline for the last 3 days.  Reviewed history: Patient was diagnosed with enlarged thyroid approximately 1 year ago - at [facility]. She had a thyroid ultrasound that showed 3 thyroid nodules. The 2 largest nodules were biopsied >> benign (10/2012).     She was describing a "lump" mid neck - for 2 years, + hoarseness for 2 years, + dysphagia - meats but also swallowing saliva laying down/no odynophagia, + SOB with lying down.  A Barium swallow showed mild impression from thyroid on Esophagus >> I referred her to surgery >> had total thyroidectomy 10/08/2013 >> final pathology shows nodular hyperplasia. Postoperative serum calcium level was normal at 9.6. She was started on Synthroid 100 mcg daily. Last visit with Dr Gerrit FriendsGerkin was on 12/13/2013.  Pt is on  Synthroid 150 mcg daily, taken: - in am - fasting - at least 30 min from b'fast - no Ca, MVI, PPIs - + Fe qod - afternoon - + on Biotin  - stopped 5-6 days ago Missed one dose in last month.  I reviewed pt's thyroid latest tests -last level normal >a year ago: Lab Results  Component Value Date   TSH 2.41 09/18/2016   TSH 2.58 01/11/2016   TSH 8.87 (H) 09/19/2015   TSH 3.67 07/28/2015   TSH 9.45 (H) 01/06/2015   TSH 8.18 (H) 09/12/2014   TSH 3.27 05/27/2014   TSH 5.61 (H) 03/22/2014   TSH 12.06 (H) 11/18/2013   TSH 1.087 07/12/2013   FREET4 1.21 09/18/2016   FREET4 1.23 01/11/2016   FREET4 0.98 09/19/2015   FREET4 1.16 07/28/2015   FREET4 0.99 01/06/2015   FREET4 0.93 09/12/2014   FREET4 1.01 05/27/2014   FREET4 0.92 03/22/2014   FREET4 0.91 11/18/2013   FREET4 1.27 07/12/2013   Patient  denies: - Weight gain - Fatigue - Cold intolerance - Constipation - Hair loss  Pt denies: - feeling nodules in neck - dysphagia - Her dysphagia resolved after the surgery. - choking - SOB with lying down But she has hoarseness - she had this even before her thyroid sx >> now worse as she has some congestion.  Pt also has a history of IDA.   ROS: Constitutional: + See HPI Eyes: no blurry vision, no xerophthalmia ENT: no sore throat, + see HPI Cardiovascular: no CP/no SOB/no palpitations/no leg swelling Respiratory: no cough/no SOB/no wheezing Gastrointestinal: no N/no V/no D/no C/no acid reflux Musculoskeletal: no muscle aches/no joint aches Skin: no rashes, no hair loss Neurological: no tremors/no numbness/no tingling/no dizziness  I reviewed pt's medications, allergies, PMH, social hx, family hx, and changes were documented in the history of present illness. Otherwise, unchanged from my initial visit note. PE: BP 118/70   Pulse 81   Ht 5' 8.25" (1.734 m)   Wt (!) 312 lb (141.5 kg)   LMP 11/18/2017   SpO2 97%   BMI 47.09 kg/m  Body mass index is 47.09 kg/m.  Wt Readings from Last 3 Encounters:  12/08/17 (!) 312 lb (141.5 kg)  01/22/17 300 lb (136.1 kg)  09/18/16 (!) 304 lb (137.9 kg)   Constitutional: Obese, in NAD Eyes: PERRLA, EOMI, no exophthalmos ENT:  moist mucous membranes, no neck masses, thyroidectomy scar healed, no cervical lymphadenopathy Cardiovascular: RRR, No MRG Respiratory: CTA B Gastrointestinal: abdomen soft, NT, ND, BS+ Musculoskeletal: no deformities, strength intact in all 4 Skin: moist, warm, no rashes Neurological: no tremor with outstretched hands, DTR normal in all 4  ASSESSMENT: 1. MNG  - thyroid ultrasound at [facility] 09/09/2012:  Right lobe is 7.3 x 2.5 x 2.2 cm. On the right, there is a solid 2.2 x 1.2 x 1.6 cm mass. The second mass measures 1.0 x 1.2 x 1.2 cm. A third mass measures 1.6 x 1.1 x 1.3 cm. There are several  subcentimeter masses on the right as well.  Left lobe is 8.0 x 3.8 x 2.8 cm There is a mass in the left lobe measuring 5.0 x 4.9 x 3.9 cm, with few tiny calcifications.  Isthmus is 8 mm in thickness.  In the isthmus, there is a mass measuring 1.8 x 1.7 x 1.4 cm all masses are solid. Enlarged thyroid with multiple solid masses.  - thyroid FNA (10/2012):   left sided 4 cm nodule: benign, with Hurthle cell and cystic changes  right-sided 2 cm nodule: benign, with Hurthle cell and cystic changes  - Ba swallow 08/27/2013: Slight impression on the anterior lower cervical esophagus, possibly related to the patient's known multinodular goiter. Brief sticking of the 13 mm barium tablet in the cervical esophagus.  2. S/p thyroidectomy - total thyroidectomy 10/08/2013 >> benign pathology  Plan: 1. MNG  - Pt has a history of total thyroidectomy in 10/2013 secondary to dysphagia and hoarseness and a barium swallow showing thyroid compressing the esophagus.   The pathology was benign. - latest thyroid labs reviewed with pt >> normal at last visit, 09/2016 - she continues on LT4 150 mcg daily - pt feels good on this dose. - we discussed about taking the thyroid hormone every day, with water, >30 minutes before breakfast, separated by >4 hours from acid reflux medications, calcium, iron, multivitamins. Pt. is taking it correctly. - will check thyroid tests today: TSH and fT4 - If labs are abnormal, she will need to return for repeat TFTs in 1.5 months  2. S/p thyroidectomy - she continues to have hoarseness - persisted from before thyroid sx - not too bothered by this - may consider voice rehab   Needs refills.  Office Visit on 12/08/2017  Component Date Value Ref Range Status  . TSH 12/08/2017 0.90  0.35 - 4.50 uIU/mL Final  . Free T4 12/08/2017 1.71* 0.60 - 1.60 ng/dL Final   Comment: Specimens from patients who are undergoing biotin therapy and /or ingesting biotin supplements may contain  high levels of biotin.  The higher biotin concentration in these specimens interferes with this Free T4 assay.  Specimens that contain high levels  of biotin may cause false high results for this Free T4 assay.  Please interpret results in light of the total clinical presentation of the patient.     Msg sent: Dear Diamond [name], Thyroid labs are good. The free T4 is a little high, but this is directly related to when you took the thyroid medication yesterday. The TSH is normal, and this is the most important test. Sincerely, Carlus Pavlov MD  Carlus Pavlov, MD PhD [facility]

## 2017-12-09 MED ORDER — LEVOTHYROXINE SODIUM 150 MCG PO TABS: ORAL_TABLET | ORAL | 3 refills | Status: DC

## 2018-04-26 ENCOUNTER — Other Ambulatory Visit: Payer: Self-pay

## 2018-04-26 ENCOUNTER — Emergency Department (HOSPITAL_COMMUNITY)
Admission: EM | Admit: 2018-04-26 | Discharge: 2018-04-26 | Disposition: A | Discharge status: Home / Self Care | Payer: Managed Care, Other (non HMO) | Attending: Emergency Medicine | Admitting: Emergency Medicine

## 2018-04-26 ENCOUNTER — Encounter (HOSPITAL_COMMUNITY): Payer: Self-pay

## 2018-04-26 DIAGNOSIS — J029 Acute pharyngitis, unspecified: Secondary | ICD-10-CM | POA: Insufficient documentation

## 2018-04-26 DIAGNOSIS — H9201 Otalgia, right ear: Secondary | ICD-10-CM | POA: Insufficient documentation

## 2018-04-26 DIAGNOSIS — J329 Chronic sinusitis, unspecified: Secondary | ICD-10-CM | POA: Diagnosis not present

## 2018-04-26 DIAGNOSIS — Z79899 Other long term (current) drug therapy: Secondary | ICD-10-CM | POA: Insufficient documentation

## 2018-04-26 DIAGNOSIS — H9311 Tinnitus, right ear: Secondary | ICD-10-CM

## 2018-04-26 DIAGNOSIS — R0981 Nasal congestion: Secondary | ICD-10-CM | POA: Insufficient documentation

## 2018-04-26 DIAGNOSIS — R490 Dysphonia: Secondary | ICD-10-CM | POA: Diagnosis present

## 2018-04-26 DIAGNOSIS — R070 Pain in throat: Secondary | ICD-10-CM | POA: Insufficient documentation

## 2018-04-26 DIAGNOSIS — E039 Hypothyroidism, unspecified: Secondary | ICD-10-CM | POA: Diagnosis not present

## 2018-04-26 MED ORDER — LORATADINE 10 MG PO TABS: 10.0000 mg | ORAL_TABLET | Freq: Every day | ORAL | 0 refills | Status: DC

## 2018-04-26 MED ORDER — FLUTICASONE PROPIONATE 50 MCG/ACT NA SUSP: 2.0000 | Freq: Every day | NASAL | 2 refills | Status: DC

## 2018-04-26 MED ORDER — FAMOTIDINE 20 MG PO TABS: 20.0000 mg | ORAL_TABLET | Freq: Two times a day (BID) | ORAL | 0 refills | Status: DC

## 2018-04-26 NOTE — ED Triage Notes (Signed)
Patient presented to ed with c/o right ear ringing started 2 days ago. Pt also c/o sore throat two weeks ago.

## 2018-04-26 NOTE — Discharge Instructions (Signed)
1.  At this time start treatment for nasal congestion and postnasal drip which can contribute to eustachian tube dysfunction.  Try 2 weeks of daily Claritin and Flonase. 2.  Gastric reflux (AKA GERD), can also cause inflammation in the throat and vocal cords.  Try 2 weeks of Pepcid as prescribed and follow dietary instructions in your discharge instructions. 3.  Start dietary and lifestyle changes for hypertension. 4.  Schedule a recheck with your family doctor within 1 to 2 weeks.

## 2018-04-26 NOTE — ED Provider Notes (Signed)
[facility]-EMERGENCY DEPT Provider Note   CSN: [id number] Arrival date & time: 04/26/18  0848     History   Chief Complaint No chief complaint on file.   HPI Diamond [name] is a 32 y.o. female.  HPI Patient reports she has been getting tightness and hoarseness in her throat.  About 2 weeks ago she thought she was having some nasal drainage and allergies contributing to hoarseness and sore throat.  She reports she took Claritin.  The symptoms did resolve for a little while but then returned.  She reports she is also getting a full pressure sensation in the right ear and a slight ringing sound.  She denies its painful.  She reports symptoms are worse at night.  No fevers, no chills.  Mild cough but no chest pain or shortness of breath.  Patient does endorse sinus congestion pressure and drainage.  Patient reports she does have history of thyroidectomy.  Patient's blood pressure reading is elevated today.  She reports her blood pressures are not usually elevated.  She reports that she has been visiting with her sister and for about 2 weeks she is continuously been eating out.  She reports that she thinks that is contributed to her pressure being up.  She denies headache, visual changes, chest pain or shortness of breath. Past Medical History:  Diagnosis Date  . Anemia   . History of chicken pox   . Multiple thyroid nodules 04/2013   3 nodules per pt  . Pneumonia    hx of as child    Patient Active Problem List   Diagnosis Date Noted  . S/P thyroidectomy 12/08/2017  . Iron deficiency anemia 09/19/2016  . 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 Routine general medical examination at a health care facility 08/23/2013  . Cervical lesion 08/23/2013    Past Surgical History:  Procedure Laterality Date  . APPENDECTOMY     "teenager"  . FRACTURE SURGERY Right    "compound fracture of leg per pt"  . HARDWARE REMOVAL     a month after fracture surgery  .  THYROIDECTOMY N/A 10/08/2013   Procedure: TOTAL THYROIDECTOMY;  Surgeon: Velora Hecklerodd M Gerkin, MD;  Location: [facility];  Service: General;  Laterality: N/A;  . TONSILLECTOMY AND ADENOIDECTOMY  2012     OB History   None      Home Medications    Prior to Admission medications   Medication Sig Start Date End Date Taking? Authorizing Provider  ibuprofen (ADVIL,MOTRIN) 200 MG tablet Take 200 mg by mouth every 6 (six) hours as needed for headache.   Yes [provider]  levothyroxine (SYNTHROID, LEVOTHROID) 150 MCG tablet TAKE 1 TABLET (150 MCG TOTAL) BY MOUTH DAILY. 12/09/17  Yes Carlus PavlovGherghe, Cristina, MD  famotidine (PEPCID) 20 MG tablet Take 1 tablet (20 mg total) by mouth 2 (two) times daily. 04/26/18   Arby BarrettePfeiffer, Carl Bleecker, MD  fluticasone (FLONASE) 50 MCG/ACT nasal spray Place 2 sprays into both nostrils daily. 04/26/18   Arby BarrettePfeiffer, Aeliana Spates, MD  loratadine (CLARITIN) 10 MG tablet Take 1 tablet (10 mg total) by mouth daily. 04/26/18   Arby BarrettePfeiffer, Catori Panozzo, MD    Family History Family History  Problem Relation Age of Onset  . Thyroid disease Mother        " had thyroid removed due to nodules per pt"  . Hyperlipidemia Father   . Hypertension Father   . Diabetes Father   . Cancer Paternal Grandmother        lung  .  Hyperlipidemia Paternal Grandmother   . Hypertension Paternal Grandmother   . Diabetes Paternal Grandmother     Social History Social History   Tobacco Use  . Smoking status: Never Smoker  . Smokeless tobacco: Never Used  Substance Use Topics  . Alcohol use: Yes    Comment: occasional  . Drug use: No     Allergies   Penicillins   Review of Systems Review of Systems 10 Systems reviewed and are negative for acute change except as noted in the HPI.  Physical Exam Updated Vital Signs BP (!) 170/96 (BP Location: Right Arm)   Pulse 79   Temp 97.6 F (36.4 C) (Oral)   Resp 18   Ht 5\' 8"  (1.727 m)   Wt (!) 137.9 kg (304 lb)   SpO2 100%   BMI 46.22 kg/m   Physical Exam    Constitutional: She is oriented to person, place, and time.  Patient is alert and nontoxic.  Clinically well in appearance.  Up and ambulatory about the emergency department.  Morbid obesity.  HENT:  Head: Normocephalic and atraumatic.  Bilateral TMs normal.  Osteoarthritis widely patent without erythema or exudate of the tonsillar pillars.  Dentition good condition.  No cervical lymphadenopathy.  Eyes: Pupils are equal, round, and reactive to light. EOM are normal.  Neck:  Neck is supple.  No cervical lymphadenopathy.  No stridor.  No thyroid mass or anterior neck masses.  Cardiovascular: Normal rate, regular rhythm, normal heart sounds and intact distal pulses.  Pulmonary/Chest: Effort normal and breath sounds normal.  Musculoskeletal: Normal range of motion.  Neurological: She is alert and oriented to person, place, and time. No cranial nerve deficit. She exhibits normal muscle tone. Coordination normal.  Skin: Skin is warm and dry.  Psychiatric: She has a normal mood and affect.     ED Treatments / Results  Labs (all labs ordered are listed, but only abnormal results are displayed) Labs Reviewed - No data to display  EKG None  Radiology No results found.  Procedures Procedures (including critical care time)  Medications Ordered in ED Medications - No data to display   Initial Impression / Assessment and Plan / ED Course  I have reviewed the triage vital signs and the nursing notes.  Pertinent labs & imaging results that were available during my care of the patient were reviewed by me and considered in my medical decision making (see chart for details).      Final Clinical Impressions(s) / ED Diagnoses   Final diagnoses:  Pharyngitis, unspecified etiology  Tinnitus of right ear  Patient presents with symptoms of throat tightness, slight dysphasia, fullness and tinnitus of the right ear.  Patient does note that the symptoms seem worse at nighttime when she is lying  down.  Clinically, patient is well in appearance.  ENT exam is normal.  At this time will start combination of Flonase and Claritin for 2 weeks for possible postnasal drip/rhinitis symptoms.  Patient also notes symptoms being more prominent at nighttime.  Will also have her trial 2 weeks of Pepcid for possible GERD.  Described ear symptoms are suggestive of eustachian tube dysfunction which may be secondary to inflammation either from GERD or rhinitis.  Patient does have a history of thyroidectomy.  There are no neck masses or stridor at this time.  She is counseled on importance of scheduling follow-up with either her PCP or ENT.  She reports she will follow through with that.  Incidental note of hypertensive blood  pressure.  Patient does not endorse any symptoms of endorgan damage.  She reports that this is not typical for her.  She attributes it to a very poor diet for 2 weeks continuously.  She plans to make dietary changes and follow-up with PCP for blood pressure monitoring.  ED Discharge Orders        Ordered    fluticasone (FLONASE) 50 MCG/ACT nasal spray  Daily     04/26/18 1104    loratadine (CLARITIN) 10 MG tablet  Daily     04/26/18 1104    famotidine (PEPCID) 20 MG tablet  2 times daily     04/26/18 1104       Arby Barrette, MD 04/26/18 1121

## 2018-07-11 ENCOUNTER — Encounter (HOSPITAL_COMMUNITY): Payer: Self-pay | Admitting: *Deleted

## 2018-07-11 ENCOUNTER — Other Ambulatory Visit: Payer: Self-pay

## 2018-07-11 ENCOUNTER — Emergency Department (HOSPITAL_COMMUNITY)
Admission: EM | Admit: 2018-07-11 | Discharge: 2018-07-11 | Disposition: A | Discharge status: Home / Self Care | Payer: BLUE CROSS/BLUE SHIELD | Attending: Emergency Medicine | Admitting: Emergency Medicine

## 2018-07-11 DIAGNOSIS — L0231 Cutaneous abscess of buttock: Secondary | ICD-10-CM | POA: Insufficient documentation

## 2018-07-11 DIAGNOSIS — Z79899 Other long term (current) drug therapy: Secondary | ICD-10-CM | POA: Insufficient documentation

## 2018-07-11 DIAGNOSIS — E039 Hypothyroidism, unspecified: Secondary | ICD-10-CM | POA: Insufficient documentation

## 2018-07-11 MED ORDER — LIDOCAINE-EPINEPHRINE (PF) 2 %-1:200000 IJ SOLN
10.0000 mL | Freq: Once | INTRAMUSCULAR | Status: AC
  Administered 2018-07-11: 10 mL
  Filled 2018-07-11: qty 20

## 2018-07-11 NOTE — ED Triage Notes (Signed)
Pt reports she has a boil on her right buttocks she noticed about 3 days ago. No drainage, no fevers.

## 2018-07-11 NOTE — Discharge Instructions (Signed)
Continue with ibuprofen 600 mg every 6 hours for pain control.  Change the dressing over top of your incision and drainage site at least 1-2 times per day.  Continue with warm compresses 3-4 times per day for 15 to 20 minutes each time to promote further drainage.  Follow-up with your primary care doctor to ensure resolution of symptoms.

## 2018-07-11 NOTE — ED Provider Notes (Signed)
[facility]-EMERGENCY DEPT Provider Note   CSN: [id number] Arrival date & time: 07/11/18  0042     History   Chief Complaint Chief Complaint  Patient presents with  . Abscess    HPI Diamond [name] is a 32 y.o. female.  The history is provided by the patient. No language interpreter was used.  Abscess  Location:  Pelvis Pelvic abscess location:  R buttock Size:  5cm Abscess quality: induration, painful and redness   Abscess quality: not draining   Red streaking: no   Duration:  3 days Progression:  Worsening Pain details:    Quality:  Pressure and aching   Duration:  3 days   Timing:  Constant   Progression:  Worsening Chronicity:  Recurrent Context: not diabetes and not immunosuppression   Relieved by:  Nothing Exacerbated by: ambulation worsens pain. Ineffective treatments:  Warm compresses Associated symptoms: no fever, no nausea and no vomiting   Risk factors: prior abscess     Past Medical History:  Diagnosis Date  . Anemia   . History of chicken pox   . Multiple thyroid nodules 04/2013   3 nodules per pt  . Pneumonia    hx of as child    Patient Active Problem List   Diagnosis Date Noted  . S/P thyroidectomy 12/08/2017  . Iron deficiency anemia 09/19/2016  . 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 Routine general medical examination at a health care facility 08/23/2013  . Cervical lesion 08/23/2013    Past Surgical History:  Procedure Laterality Date  . APPENDECTOMY     "teenager"  . FRACTURE SURGERY Right    "compound fracture of leg per pt"  . HARDWARE REMOVAL     a month after fracture surgery  . THYROIDECTOMY N/A 10/08/2013   Procedure: TOTAL THYROIDECTOMY;  Surgeon: Velora Heckler, MD;  Location: [facility];  Service: General;  Laterality: N/A;  . TONSILLECTOMY AND ADENOIDECTOMY  2012     OB History   None      Home Medications    Prior to Admission medications   Medication Sig Start Date End Date  Taking? Authorizing Provider  ferrous sulfate 325 (65 FE) MG tablet Take 325 mg by mouth daily with breakfast.   Yes [provider]  ibuprofen (ADVIL,MOTRIN) 200 MG tablet Take 400 mg by mouth every 6 (six) hours as needed for headache.    Yes [provider]  levothyroxine (SYNTHROID, LEVOTHROID) 150 MCG tablet TAKE 1 TABLET (150 MCG TOTAL) BY MOUTH DAILY. Patient taking differently: Take 150 mcg by mouth daily before breakfast.  12/09/17  Yes Carlus Pavlov, MD  famotidine (PEPCID) 20 MG tablet Take 1 tablet (20 mg total) by mouth 2 (two) times daily. Patient not taking: Reported on 07/11/2018 04/26/18   Arby Barrette, MD  fluticasone [facility]) 50 MCG/ACT nasal spray Place 2 sprays into both nostrils daily. Patient not taking: Reported on 07/11/2018 04/26/18   Arby Barrette, MD  loratadine (CLARITIN) 10 MG tablet Take 1 tablet (10 mg total) by mouth daily. Patient not taking: Reported on 07/11/2018 04/26/18   Arby Barrette, MD    Family History Family History  Problem Relation Age of Onset  . Thyroid disease Mother        " had thyroid removed due to nodules per pt"  . Hyperlipidemia Father   . Hypertension Father   . Diabetes Father   . Cancer Paternal Grandmother        lung  . Hyperlipidemia Paternal Grandmother   .  Hypertension Paternal Grandmother   . Diabetes Paternal Grandmother     Social History Social History   Tobacco Use  . Smoking status: Never Smoker  . Smokeless tobacco: Never Used  Substance Use Topics  . Alcohol use: Yes    Comment: occasional  . Drug use: No     Allergies   Penicillins   Review of Systems Review of Systems  Constitutional: Negative for fever.  Gastrointestinal: Negative for nausea and vomiting.  Ten systems reviewed and are negative for acute change, except as noted in the HPI.    Physical Exam Updated Vital Signs BP (!) 145/82   Pulse 74   Temp 98.7 F (37.1 C) (Oral)   Resp 20   Ht 5\' 8"  (1.727 m)   Wt  (!) 137.9 kg   LMP 07/04/2018   SpO2 100%   BMI 46.22 kg/m   Physical Exam  Constitutional: She is oriented to person, place, and time. She appears well-developed and well-nourished. No distress.  Nontoxic   HENT:  Head: Normocephalic and atraumatic.  Eyes: Conjunctivae and EOM are normal. No scleral icterus.  Neck: Normal range of motion.  Pulmonary/Chest: Effort normal. No stridor. No respiratory distress.  Respirations even and unlabored  Musculoskeletal: Normal range of motion.       Right upper leg: She exhibits tenderness.       Legs: Neurological: She is alert and oriented to person, place, and time. She exhibits normal muscle tone. Coordination normal.  Skin: Skin is warm and dry. No rash noted. She is not diaphoretic. No erythema. No pallor.  Psychiatric: She has a normal mood and affect. Her behavior is normal.  Nursing note and vitals reviewed.    ED Treatments / Results  Labs (all labs ordered are listed, but only abnormal results are displayed) Labs Reviewed - No data to display  EKG None  Radiology No results found.  Procedures Procedures (including critical care time)  INCISION AND DRAINAGE Performed by: Antony Madura Consent: Verbal consent obtained. Risks and benefits: risks, benefits and alternatives were discussed Type: abscess  Body area: R gluteal  Anesthesia: local infiltration  Incision was made with a scalpel.  Local anesthetic: lidocaine 1% with epinephrine  Anesthetic total: 3 ml  Complexity: complex Blunt dissection to break up loculations  Drainage: purulent  Drainage amount: 2cc  Packing material: none  Patient tolerance: Patient tolerated the procedure well with no immediate complications.   Medications Ordered in ED Medications  lidocaine-EPINEPHrine (XYLOCAINE W/EPI) 2 %-1:200000 (PF) injection 10 mL (10 mLs Other Given 07/11/18 0404)     Initial Impression / Assessment and Plan / ED Course  I have reviewed the  triage vital signs and the nursing notes.  Pertinent labs & imaging results that were available during my care of the patient were reviewed by me and considered in my medical decision making (see chart for details).     Patient with skin abscess amenable to incision and drainage.  Abscess was not large enough to warrant packing or drain,  wound recheck in 2 days. Encouraged home warm soaks and flushing.  Mild signs of cellulitis is surrounding skin.  Will d/c to home.  No antibiotic therapy is indicated.  Return precautions discussed and provided. Patient discharged in stable condition with no unaddressed concerns.   Final Clinical Impressions(s) / ED Diagnoses   Final diagnoses:  Abscess of buttock, right    ED Discharge Orders    None       Antony Madura,  PA-C 07/11/18 0446    Nira Conn, MD 07/11/18 [phone]

## 2018-09-02 ENCOUNTER — Encounter (HOSPITAL_COMMUNITY): Payer: Self-pay

## 2018-09-02 ENCOUNTER — Emergency Department (HOSPITAL_COMMUNITY)
Admission: EM | Admit: 2018-09-02 | Discharge: 2018-09-02 | Disposition: A | Discharge status: Home / Self Care | Payer: Self-pay | Attending: Emergency Medicine | Admitting: Emergency Medicine

## 2018-09-02 ENCOUNTER — Other Ambulatory Visit: Payer: Self-pay

## 2018-09-02 DIAGNOSIS — L84 Corns and callosities: Secondary | ICD-10-CM

## 2018-09-02 DIAGNOSIS — E079 Disorder of thyroid, unspecified: Secondary | ICD-10-CM | POA: Insufficient documentation

## 2018-09-02 DIAGNOSIS — M79674 Pain in right toe(s): Secondary | ICD-10-CM | POA: Insufficient documentation

## 2018-09-02 DIAGNOSIS — M79675 Pain in left toe(s): Secondary | ICD-10-CM | POA: Insufficient documentation

## 2018-09-02 NOTE — ED Triage Notes (Signed)
Pt states her bilateral pinky toes are swelling. Pt thought it was a corn at first, but problem will not resolve. Pt states that she tried over the counter meds without relief.

## 2018-09-02 NOTE — ED Provider Notes (Signed)
Emergency Department Provider Note   I have reviewed the triage vital signs and the nursing notes.   HISTORY  Chief Complaint Toe Pain   HPI Diamond [name] is a 32 y.o. female presents to the emergency department for evaluation of calluses forming on the bilateral fifth toes.  Symptoms have been ongoing and worsening over the past 2 months.  The patient states that she is tried filing them down becoming increasingly painful.  Denies any redness, drainage, fever.  No injury to the feet.  Patient states that she works 12 to 13 hours a day and standing all day is becoming painful.  No radiation of symptoms or other modifying factors.  Past Medical History:  Diagnosis Date  . Anemia   . History of chicken pox   . Multiple thyroid nodules 04/2013   3 nodules per pt  . Pneumonia    hx of as child    Patient Active Problem List   Diagnosis Date Noted  . S/P thyroidectomy 12/08/2017  . Iron deficiency anemia 09/19/2016  . 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 Routine general medical examination at a health care facility 08/23/2013  . Cervical lesion 08/23/2013    Past Surgical History:  Procedure Laterality Date  . APPENDECTOMY     "teenager"  . FRACTURE SURGERY Right    "compound fracture of leg per pt"  . HARDWARE REMOVAL     a month after fracture surgery  . THYROIDECTOMY N/A 10/08/2013   Procedure: TOTAL THYROIDECTOMY;  Surgeon: Velora Heckler, MD;  Location: [facility];  Service: General;  Laterality: N/A;  . TONSILLECTOMY AND ADENOIDECTOMY  2012    Allergies Penicillins  Family History  Problem Relation Age of Onset  . Thyroid disease Mother        " had thyroid removed due to nodules per pt"  . Hyperlipidemia Father   . Hypertension Father   . Diabetes Father   . Cancer Paternal Grandmother        lung  . Hyperlipidemia Paternal Grandmother   . Hypertension Paternal Grandmother   . Diabetes Paternal Grandmother     Social History Social History     Tobacco Use  . Smoking status: Never Smoker  . Smokeless tobacco: Never Used  Substance Use Topics  . Alcohol use: Yes    Comment: occasional  . Drug use: No    Review of Systems  Constitutional: No fever/chills Eyes: No visual changes. ENT: No sore throat. Cardiovascular: Denies chest pain. Respiratory: Denies shortness of breath. Gastrointestinal: No abdominal pain.  No nausea, no vomiting.  No diarrhea.  No constipation. Genitourinary: Negative for dysuria. Musculoskeletal: Negative for back pain. Bilateral toe pain.  Skin: Negative for rash. Neurological: Negative for headaches, focal weakness or numbness.  10-point ROS otherwise negative.  ____________________________________________   PHYSICAL EXAM:  VITAL SIGNS: ED Triage Vitals  Enc Vitals Group     BP 09/02/18 0831 (!) 154/93     Pulse Rate 09/02/18 0831 82     Resp 09/02/18 0831 17     Temp 09/02/18 0831 98 F (36.7 C)     Temp Source 09/02/18 0831 Oral     SpO2 09/02/18 0831 98 %     Weight 09/02/18 0834 (!) 304 lb (137.9 kg)     Height 09/02/18 0834 5\' 8"  (1.727 m)     Pain Score 09/02/18 0834 7   Constitutional: Alert and oriented. Well appearing and in no acute distress. Eyes: Conjunctivae are normal.  Head: Atraumatic.  Nose: No congestion/rhinnorhea. Mouth/Throat: Mucous membranes are moist.  Neck: No stridor.   Respiratory: Normal respiratory effort.  Gastrointestinal: No distention.  Musculoskeletal: Corns to the bilateral little toes. No cellulitis, swelling, or drainage.  Neurologic:  Normal speech and language.  Skin: No rash noted.  ____________________________________________  RADIOLOGY  None ____________________________________________   PROCEDURES  Procedure(s) performed:   Procedures  None ____________________________________________   INITIAL IMPRESSION / ASSESSMENT AND PLAN / ED COURSE  Pertinent labs & imaging results that were available during my care of the  patient were reviewed by me and considered in my medical decision making (see chart for details).  Patient with corns to the bilateral feet.  No indication for imaging or other emergent treatment.  I advised that this is caused by friction likely in her shoes.  She will try to purchase wider-based shoes.  Also discussed using mole-skin to reduce friction. Provided contact information to podiatry for outpatient follow up if symptoms worsen.    ____________________________________________  FINAL CLINICAL IMPRESSION(S) / ED DIAGNOSES  Final diagnoses:  Corns of multiple toes    Note:  This document was prepared using Dragon voice recognition software and may include unintentional dictation errors.  Alona Bene, MD Emergency Medicine    Long, Arlyss Repress, MD 09/02/18 [phone]

## 2018-09-02 NOTE — Discharge Instructions (Addendum)
You were seen today with corns on the feet. You can read the attached information. I have provided contact information for the podiatrist.

## 2018-10-16 ENCOUNTER — Emergency Department (HOSPITAL_COMMUNITY): Payer: Self-pay

## 2018-10-16 ENCOUNTER — Encounter (HOSPITAL_COMMUNITY): Payer: Self-pay

## 2018-10-16 ENCOUNTER — Emergency Department (HOSPITAL_COMMUNITY)
Admission: EM | Admit: 2018-10-16 | Discharge: 2018-10-16 | Disposition: A | Discharge status: Home / Self Care | Payer: Self-pay | Attending: Emergency Medicine | Admitting: Emergency Medicine

## 2018-10-16 DIAGNOSIS — J069 Acute upper respiratory infection, unspecified: Secondary | ICD-10-CM | POA: Insufficient documentation

## 2018-10-16 DIAGNOSIS — Z79899 Other long term (current) drug therapy: Secondary | ICD-10-CM | POA: Insufficient documentation

## 2018-10-16 MED ORDER — LORATADINE 10 MG PO TABS: 10.0000 mg | ORAL_TABLET | Freq: Every day | ORAL | 0 refills | Status: DC

## 2018-10-16 MED ORDER — FLUTICASONE PROPIONATE 50 MCG/ACT NA SUSP: 2.0000 | Freq: Every day | NASAL | 0 refills | Status: DC

## 2018-10-16 NOTE — ED Provider Notes (Signed)
[facility]-EMERGENCY DEPT Provider Note   CSN: [id number] Arrival date & time: 10/16/18  1404     History   Chief Complaint Chief Complaint  Patient presents with  . Cough  . Chills  . Generalized Body Aches    HPI Diamond [name] is a 32 y.o. female.  32 y.o female with a PMH of Anemia presents to the ED with a chief complaint of chills, sore throat and body aches. Patient reports she's had a productive cough with clear sputum, she also has been feeling chills for the past 4 days. She reports the symptoms began with rhinorrhea but seem to have worsen. Patient has tried dayquil and nyquil without relieve in symptoms. She denies getting a flu shot this season or in the past. She also reports a fever with a Tmax of 102. She denies any chest pain, shortness of breath, abdominal pain or difficulty swallowing.      Past Medical History:  Diagnosis Date  . Anemia   . History of chicken pox   . Multiple thyroid nodules 04/2013   3 nodules per pt  . Pneumonia    hx of as child    Patient Active Problem List   Diagnosis Date Noted  . S/P thyroidectomy 12/08/2017  . Iron deficiency anemia 09/19/2016  . 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 Routine general medical examination at a health care facility 08/23/2013  . Cervical lesion 08/23/2013    Past Surgical History:  Procedure Laterality Date  . APPENDECTOMY     "teenager"  . FRACTURE SURGERY Right    "compound fracture of leg per pt"  . HARDWARE REMOVAL     a month after fracture surgery  . THYROIDECTOMY N/A 10/08/2013   Procedure: TOTAL THYROIDECTOMY;  Surgeon: Velora Hecklerodd M Gerkin, MD;  Location: [facility];  Service: General;  Laterality: N/A;  . TONSILLECTOMY AND ADENOIDECTOMY  2012     OB History   No obstetric history on file.      Home Medications    Prior to Admission medications   Medication Sig Start Date End Date Taking? Authorizing Provider  dextromethorphan (DELSYM) 30 MG/5ML  liquid Take 30 mg by mouth as needed for cough.   Yes [provider]  ferrous sulfate 325 (65 FE) MG tablet Take 325 mg by mouth every other day.    Yes [provider]  levothyroxine (SYNTHROID, LEVOTHROID) 150 MCG tablet TAKE 1 TABLET (150 MCG TOTAL) BY MOUTH DAILY. Patient taking differently: Take 150 mcg by mouth daily before breakfast.  12/09/17  Yes Carlus PavlovGherghe, Cristina, MD  famotidine (PEPCID) 20 MG tablet Take 1 tablet (20 mg total) by mouth 2 (two) times daily. Patient not taking: Reported on 07/11/2018 04/26/18   Arby BarrettePfeiffer, Marcy, MD  fluticasone [facility](FLONASE) 50 MCG/ACT nasal spray Place 2 sprays into both nostrils daily for 5 days. 10/16/18 10/21/18  Claude MangesSoto, Edyn Qazi, PA-C  loratadine (CLARITIN) 10 MG tablet Take 1 tablet (10 mg total) by mouth daily for 5 days. 10/16/18 10/21/18  Claude MangesSoto, Alaijah Gibler, PA-C    Family History Family History  Problem Relation Age of Onset  . Thyroid disease Mother        " had thyroid removed due to nodules per pt"  . Hyperlipidemia Father   . Hypertension Father   . Diabetes Father   . Cancer Paternal Grandmother        lung  . Hyperlipidemia Paternal Grandmother   . Hypertension Paternal Grandmother   . Diabetes Paternal Grandmother  Social History Social History   Tobacco Use  . Smoking status: Never Smoker  . Smokeless tobacco: Never Used  Substance Use Topics  . Alcohol use: Yes    Comment: occasional  . Drug use: No     Allergies   Penicillins   Review of Systems Review of Systems  Constitutional: Positive for fever.  HENT: Positive for rhinorrhea and sore throat. Negative for sinus pressure and sinus pain.   Respiratory: Positive for cough. Negative for shortness of breath and wheezing.   Cardiovascular: Negative for chest pain.  Gastrointestinal: Negative for abdominal pain, nausea and vomiting.  Genitourinary: Negative for dysuria and flank pain.  Musculoskeletal: Negative for back pain.  Skin: Negative for pallor  and wound.  Neurological: Negative for light-headedness and headaches.     Physical Exam Updated Vital Signs BP (!) 168/108 (BP Location: Right Arm)   Pulse 87   Temp 98.4 F (36.9 C) (Oral)   Resp 20   Ht 5\' 8"  (1.727 m)   Wt (!) 138.3 kg   LMP 09/25/2018 (Approximate)   SpO2 99%   BMI 46.38 kg/m   Physical Exam Vitals signs and nursing note reviewed.  Constitutional:      Appearance: She is obese. She is not ill-appearing or diaphoretic.  HENT:     Head: Normocephalic and atraumatic.     Mouth/Throat:     Lips: Pink.     Mouth: Mucous membranes are moist.     Pharynx: Oropharynx is clear. Uvula midline. Posterior oropharyngeal erythema present. No uvula swelling.     Tonsils: No tonsillar exudate or tonsillar abscesses.  Eyes:     Pupils: Pupils are equal, round, and reactive to light.  Neck:     Musculoskeletal: Normal range of motion and neck supple. No neck rigidity or crepitus.  Cardiovascular:     Rate and Rhythm: Normal rate.  Pulmonary:     Effort: Pulmonary effort is normal.     Breath sounds: Normal breath sounds. No wheezing.  Abdominal:     General: Abdomen is flat. There is no distension.     Tenderness: There is no abdominal tenderness.  Lymphadenopathy:     Head:     Right side of head: Tonsillar adenopathy present.     Left side of head: Tonsillar adenopathy present.  Skin:    General: Skin is warm and dry.  Neurological:     Mental Status: She is alert and oriented to person, place, and time.      ED Treatments / Results  Labs (all labs ordered are listed, but only abnormal results are displayed) Labs Reviewed - No data to display  EKG None  Radiology Dg Chest 2 View  Result Date: 10/16/2018 CLINICAL DATA:  Cough EXAM: CHEST - 2 VIEW COMPARISON:  11/18/2016 FINDINGS: Stable mild cardiomegaly. There is no edema, consolidation, effusion, or pneumothorax. Negative mediastinal contours. Surgical clips from thyroidectomy. IMPRESSION:  Stable from prior.  No evidence of active disease. Electronically Signed   By: Marnee Spring M.D.   On: 10/16/2018 15:23    Procedures Procedures (including critical care time)  Medications Ordered in ED Medications - No data to display   Initial Impression / Assessment and Plan / ED Course  I have reviewed the triage vital signs and the nursing notes.  Pertinent labs & imaging results that were available during my care of the patient were reviewed by me and considered in my medical decision making (see chart for details).  Patient presents with URI symptoms x 4 days.  Tried over-the-counter therapy but reports no relieving symptoms.  Patient reports she had the flu and pneumonia last year at the same time.  During evaluation patient has no wheezing, lung sounds are present.  No erythema on the oropharynx, no tonsillar exudate.  Patient does sound congested but is overall well-appearing.  Will send home with Flonase along with Claritin to help with her symptoms.  DG chest x-ray showed no consolidation, pneumothorax, pleural effusion.  Low suspicion for influenza as patient is overall well-appearing and symptoms have been going on for 4 days therefore she would not be qualify for Tamiflu.  His vitals stable during ED visit, afebrile.  Return precautions discussed with patient at length.  Final Clinical Impressions(s) / ED Diagnoses   Final diagnoses:  Upper respiratory tract infection, unspecified type    ED Discharge Orders         Ordered    fluticasone (FLONASE) 50 MCG/ACT nasal spray  Daily     10/16/18 1542    loratadine (CLARITIN) 10 MG tablet  Daily     10/16/18 1542           Claude Manges, PA-C 10/16/18 1544    Bethann Berkshire, MD 10/19/18 1005

## 2018-10-16 NOTE — ED Triage Notes (Signed)
Pt presents with c/o cough, chill, body aches that have been present for about 4 days. Pt reports that this year last time she had both the flu and pneumonia at the same time.

## 2018-10-16 NOTE — Discharge Instructions (Signed)
Please purchase an expectorant like Mucinex to help loosen and thin the mucus production.You may also try some over the counter Robitussin to help with your cough. Continue to hydrate with plenty of fluids and Gatorade.If you experience any shortness of breath, chest pain or fever please return to the ED for reevaluation.  ° °

## 2018-12-08 ENCOUNTER — Ambulatory Visit: Payer: BLUE CROSS/BLUE SHIELD | Admitting: Internal Medicine

## 2018-12-08 ENCOUNTER — Encounter: Payer: Self-pay | Admitting: Internal Medicine

## 2018-12-08 ENCOUNTER — Ambulatory Visit: Payer: Self-pay | Admitting: Internal Medicine

## 2018-12-08 VITALS — BP 122/86 | HR 80 | Ht 68.25 in | Wt 314.0 lb

## 2018-12-08 DIAGNOSIS — E890 Postprocedural hypothyroidism: Secondary | ICD-10-CM

## 2018-12-08 DIAGNOSIS — Z9889 Other specified postprocedural states: Secondary | ICD-10-CM

## 2018-12-08 LAB — TSH: TSH: 8.24 u[IU]/mL — ABNORMAL HIGH (ref 0.35–4.50)

## 2018-12-08 LAB — T4, FREE: Free T4: 1.21 ng/dL (ref 0.60–1.60)

## 2018-12-08 NOTE — Progress Notes (Signed)
Patient ID: Diamond [name], female   DOB: Jun 08, 1986, 33 y.o.   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 Last visit 1 year ago.  Started a new job 1 mo ago >> feels stressed, feels many hours a week.  Reviewed history: Patient was diagnosed with enlarged thyroid approximately 1 year ago - at [facility]. She had a thyroid ultrasound that showed 3 thyroid nodules. The 2 largest nodules were biopsied >> benign (10/2012).     She was describing a "lump" mid neck - for 2 years, + hoarseness for 2 years, + dysphagia - meats but also swallowing saliva laying down/no odynophagia, + SOB with lying down.  A Barium swallow showed mild impression from thyroid on Esophagus >> I referred her to surgery >> had total thyroidectomy 10/08/2013 >> final pathology shows nodular hyperplasia. Postoperative serum calcium level was normal at 9.6. She was started on Synthroid 100 mcg daily. Last visit with Dr Gerrit Friends was on 12/13/2013.  Pt is on Synthroid 150 Mcg daily, taken: - daily, but missed 2 days 1 mo ago! (left town w/o her tabs) - in am - fasting - at least 30 min from b'fast - no Ca, MVI, PPIs - takes iron every other day, in the p.m. - + on Biotin in MVI -  Stopped 4 days ago  Reviewed patient's TFTs: Lab Results  Component Value Date   TSH 0.90 12/08/2017   TSH 2.41 09/18/2016   TSH 2.58 01/11/2016   TSH 8.87 (H) 09/19/2015   TSH 3.67 07/28/2015   TSH 9.45 (H) 01/06/2015   TSH 8.18 (H) 09/12/2014   TSH 3.27 05/27/2014   TSH 5.61 (H) 03/22/2014   TSH 12.06 (H) 11/18/2013   FREET4 1.71 (H) 12/08/2017   FREET4 1.21 09/18/2016   FREET4 1.23 01/11/2016   FREET4 0.98 09/19/2015   FREET4 1.16 07/28/2015   FREET4 0.99 01/06/2015   FREET4 0.93 09/12/2014   FREET4 1.01 05/27/2014   FREET4 0.92 03/22/2014   FREET4 0.91 11/18/2013   Patient denies: - Weight gain - Fatigue - Cold  intolerance - Constipation - Hair loss  Pt denies: - feeling nodules in neck - dysphagia - choking - SOB with lying down  She continues to have hoarseness, which was present before the surgery.  Pt also has a history of IDA.   Father had an AMI 3 weeks ago.  ROS: Constitutional: + See HPI Eyes: no blurry vision, no xerophthalmia ENT: no sore throat, + see HPI, + congestion Cardiovascular: no CP/no SOB/no palpitations/no leg swelling Respiratory: no cough/no SOB/no wheezing Gastrointestinal: no N/no V/no D/no C/no acid reflux Musculoskeletal: no muscle aches/no joint aches Skin: no rashes, + hair loss Neurological: no tremors/no numbness/no tingling/no dizziness  I reviewed pt's medications, allergies, PMH, social hx, family hx, and changes were documented in the history of present illness. Otherwise, unchanged from my initial visit note.  Past Medical History:  Diagnosis Date  . Anemia   . History of chicken pox   . Multiple thyroid nodules 04/2013   3 nodules per pt  . Pneumonia    hx of as child   Past Surgical History:  Procedure Laterality Date  . APPENDECTOMY     "teenager"  . FRACTURE SURGERY Right    "compound fracture of leg per pt"  . HARDWARE REMOVAL     a month after fracture surgery  . THYROIDECTOMY N/A 10/08/2013   Procedure:  TOTAL THYROIDECTOMY;  Surgeon: Velora Heckler, MD;  Location: [facility];  Service: General;  Laterality: N/A;  . TONSILLECTOMY AND ADENOIDECTOMY  2012   Social History   Socioeconomic History  . Marital status: Single    Spouse name: Not on file  . Number of children: Not on file  . Years of education: Not on file  . Highest education level: Not on file  Occupational History  . Not on file  Social Needs  . Financial resource strain: Not on file  . Food insecurity:    Worry: Not on file    Inability: Not on file  . Transportation needs:    Medical: Not on file    Non-medical: Not on file  Tobacco Use  . Smoking status:  Never Smoker  . Smokeless tobacco: Never Used  Substance and Sexual Activity  . Alcohol use: Yes    Comment: occasional  . Drug use: No  . Sexual activity: Yes    Partners: Male  Lifestyle  . Physical activity:    Days per week: Not on file    Minutes per session: Not on file  . Stress: Not on file  Relationships  . Social connections:    Talks on phone: Not on file    Gets together: Not on file    Attends religious service: Not on file    Active member of club or organization: Not on file    Attends meetings of clubs or organizations: Not on file    Relationship status: Not on file  . Intimate partner violence:    Fear of current or ex partner: Not on file    Emotionally abused: Not on file    Physically abused: Not on file    Forced sexual activity: Not on file  Other Topics Concern  . 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 ferrous sulfate 325 (65 FE) MG tablet Take 325 mg by mouth every other day.     . levothyroxine (SYNTHROID, LEVOTHROID) 150 MCG tablet TAKE 1 TABLET (150 MCG TOTAL) BY MOUTH DAILY. 90 tablet 3  . dextromethorphan (DELSYM) 30 MG/5ML liquid Take 30 mg by mouth as needed for cough.    . famotidine (PEPCID) 20 MG tablet Take 1 tablet (20 mg total) by mouth 2 (two) times daily. (Patient not taking: Reported on 07/11/2018) 30 tablet 0  . fluticasone (FLONASE) 50 MCG/ACT nasal spray Place 2 sprays into both nostrils daily for 5 days. 1 g 0  . loratadine (CLARITIN) 10 MG tablet Take 1 tablet (10 mg total) by mouth daily for 5 days. 5 tablet 0    No current facility-administered medications on file prior to visit.    Allergies  Allergen Reactions  . Penicillins Rash    Has patient had a PCN reaction causing immediate rash, facial/tongue/throat swelling, SOB or lightheadedness with hypotension: Y Has patient had a PCN reaction causing severe rash involving mucus membranes or  skin necrosis: Y Has patient had a PCN reaction that required hospitalization: N Has patient had a PCN reaction occurring within the last 10 years: Y If all of the above answers are "NO", then may proceed with Cephalosporin use.    Family History  Problem Relation Age of Onset  . Thyroid disease Mother        " had thyroid removed due to nodules per pt"  . Hyperlipidemia Father   . Hypertension Father   . Diabetes Father   . Cancer Paternal Grandmother        lung  . Hyperlipidemia Paternal Grandmother   . Hypertension Paternal Grandmother   . Diabetes Paternal Grandmother     PE: BP 122/86   Pulse 80   Ht 5' 8.25" (1.734 m) Comment: measured  Wt (!) 314 lb (142.4 kg)   SpO2 97%   BMI 47.39 kg/m  Body mass index is 47.39 kg/m.  Wt Readings from Last 3 Encounters:  12/08/18 (!) 314 lb (142.4 kg)  10/16/18 (!) 305 lb (138.3 kg)  09/02/18 (!) 304 lb (137.9 kg)   Constitutional: Obese, in NAD Eyes: PERRLA, EOMI, no exophthalmos ENT: moist mucous membranes, no thyromegaly, no cervical lymphadenopathy Cardiovascular: RRR, No MRG Respiratory: CTA B Gastrointestinal: abdomen soft, NT, ND, BS+ Musculoskeletal: no deformities, strength intact in all 4 Skin: moist, warm, no rashes Neurological: no tremor with outstretched hands, DTR normal in all 4  ASSESSMENT: 1. MNG  - thyroid ultrasound at [facility] 09/09/2012:  Right lobe is 7.3 x 2.5 x 2.2 cm. On the right, there is a solid 2.2 x 1.2 x 1.6 cm mass. The second mass measures 1.0 x 1.2 x 1.2 cm. A third mass measures 1.6 x 1.1 x 1.3 cm. There are several subcentimeter masses on  the right as well.  Left lobe is 8.0 x 3.8 x 2.8 cm There is a mass in the left lobe measuring 5.0 x 4.9 x 3.9 cm, with few tiny calcifications.  Isthmus is 8 mm in thickness.  In the isthmus, there is a mass measuring 1.8 x 1.7 x 1.4 cm all masses are solid. Enlarged thyroid with multiple solid masses.  - thyroid FNA (10/2012):   left sided 4 cm nodule: benign, with Hurthle cell and cystic changes  right-sided 2 cm nodule: benign, with Hurthle cell and cystic changes  - Ba swallow 08/27/2013: Slight impression on the anterior lower cervical esophagus, possibly related to the patient's known multinodular goiter. Brief sticking of the 13 mm barium tablet in the cervical esophagus.  2. S/p thyroidectomy - total thyroidectomy 10/08/2013 >> benign pathology  Plan: 1. MNG  - Pt has a history of total thyroidectomy in 10/2013 due to dysphagia and hoarseness and a barium swallow showing thyroid compression on the esophagus.  Pathology was benign. - latest thyroid labs reviewed with pt >> normal at last visit - she continues on LT4 150 mcg daily - pt feels good on this dose, with no complaints. - we discussed about taking the thyroid hormone every day, with water, >30 minutes before breakfast, separated by >4 hours from acid reflux medications, calcium, iron, multivitamins. Pt. is taking it correctly. - will check thyroid tests today: TSH and fT4 - If labs are abnormal, she will need to return for repeat TFTs in 1.5 months  2. S/p thyroidectomy -She continues to have hoarseness, but this persists from before her thyroid surgery -Not too bothered by this. -We discussed at last visits that she may  benefit from voice rehab.   Office Visit on 12/08/2018  Component Date Value Ref Range Status  . TSH 12/08/2018 8.24* 0.35 - 4.50 uIU/mL Final  . Free T4 12/08/2018 1.21  0.60 - 1.60 ng/dL Final   Comment: Specimens from patients who are undergoing biotin therapy and /or ingesting biotin  supplements may contain high levels of biotin.  The higher biotin concentration in these specimens interferes with this Free T4 assay.  Specimens that contain high levels  of biotin may cause false high results for this Free T4 assay.  Please interpret results in light of the total clinical presentation of the patient.     TSH is high, probably due to missed levothyroxine doses.  I will have the patient back in 1.5 months to recheck, but I would not suggest to change the dose at this point.  Carlus Pavlov, MD PhD [facility] Endocrinology

## 2018-12-08 NOTE — Patient Instructions (Signed)
Please stop at the lab.  Please continue Levothyroxine 150 mcg daily.  Take the thyroid hormone every day, with water, >30 minutes before breakfast, separated by >4 hours from acid reflux medications, calcium, iron, multivitamins.  Please return in 1 year.

## 2018-12-09 ENCOUNTER — Other Ambulatory Visit: Payer: Self-pay | Admitting: Internal Medicine

## 2019-01-11 ENCOUNTER — Other Ambulatory Visit (INDEPENDENT_AMBULATORY_CARE_PROVIDER_SITE_OTHER): Payer: Self-pay

## 2019-01-11 ENCOUNTER — Encounter: Payer: Self-pay | Admitting: Internal Medicine

## 2019-01-11 DIAGNOSIS — E890 Postprocedural hypothyroidism: Secondary | ICD-10-CM

## 2019-01-11 LAB — T4, FREE: Free T4: 1.08 ng/dL (ref 0.60–1.60)

## 2019-01-11 LAB — TSH: TSH: 8.2 u[IU]/mL — ABNORMAL HIGH (ref 0.35–4.50)

## 2019-01-12 ENCOUNTER — Other Ambulatory Visit: Payer: Self-pay | Admitting: Internal Medicine

## 2019-01-12 DIAGNOSIS — E890 Postprocedural hypothyroidism: Secondary | ICD-10-CM

## 2019-01-12 MED ORDER — LEVOTHYROXINE SODIUM 175 MCG PO TABS: 175.0000 ug | ORAL_TABLET | Freq: Every day | ORAL | 3 refills | Status: DC

## 2019-01-17 ENCOUNTER — Encounter (HOSPITAL_COMMUNITY): Payer: Self-pay

## 2019-01-17 ENCOUNTER — Emergency Department (HOSPITAL_COMMUNITY): Payer: Self-pay

## 2019-01-17 ENCOUNTER — Other Ambulatory Visit: Payer: Self-pay

## 2019-01-17 ENCOUNTER — Emergency Department (HOSPITAL_COMMUNITY)
Admission: EM | Admit: 2019-01-17 | Discharge: 2019-01-17 | Disposition: A | Discharge status: Home / Self Care | Payer: Self-pay | Attending: Emergency Medicine | Admitting: Emergency Medicine

## 2019-01-17 DIAGNOSIS — M79672 Pain in left foot: Secondary | ICD-10-CM | POA: Insufficient documentation

## 2019-01-17 DIAGNOSIS — Z79899 Other long term (current) drug therapy: Secondary | ICD-10-CM | POA: Insufficient documentation

## 2019-01-17 DIAGNOSIS — E039 Hypothyroidism, unspecified: Secondary | ICD-10-CM | POA: Insufficient documentation

## 2019-01-17 DIAGNOSIS — R21 Rash and other nonspecific skin eruption: Secondary | ICD-10-CM | POA: Insufficient documentation

## 2019-01-17 MED ORDER — HYDROCORTISONE 2.5 % EX LOTN: TOPICAL_LOTION | Freq: Two times a day (BID) | CUTANEOUS | 0 refills | Status: DC

## 2019-01-17 NOTE — ED Triage Notes (Signed)
Patient arrived via POV with sister at bedside. Patient is AOx4 and ambulatory. Patient chief complaint is left foot pain which as been going on for about 1 week and pt states it was injured while getting out of car. Rash on right hand began about 2 weeks ago however has not decreased in size or color but has increased in size per patient. Patient has no other complaints.

## 2019-01-17 NOTE — ED Provider Notes (Signed)
[facility]-EMERGENCY DEPT Provider Note   CSN: [id number] Arrival date & time: 01/17/19  1042    History   Chief Complaint Chief Complaint  Patient presents with  . Left Foot Pain  . Rash on Right Hand    HPI Diamond [name] is a 33 y.o. female who presents with a one-week history of rash to right wrist and 2-day history of left foot pain.  Patient reports the rash to her right wrist seems to be getting bigger at times.  It is not itchy or painful, however sometimes it gets red.  Patient reports her gloves at work hit to that area.  She has no other rashes anywhere else.  She is not tried anything on it.  Regarding her foot pain, patient reports she twisted getting out of the car couple days ago.  Since then, she has had pain with bearing weight.  She has pain to her medial foot and ankle as well as the top of her foot.  She is not having any pain right now because she has been staying off of it.  She is on her feet 12 hours a day.  She has not tried any medication for either problem at home.     HPI  Past Medical History:  Diagnosis Date  . Anemia   . History of chicken pox   . Multiple thyroid nodules 04/2013   3 nodules per pt  . Pneumonia    hx of as child    Patient Active Problem List   Diagnosis Date Noted  . S/P thyroidectomy 12/08/2017  . Iron deficiency anemia 09/19/2016  . 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 Routine general medical examination at a health care facility 08/23/2013  . Cervical lesion 08/23/2013    Past Surgical History:  Procedure Laterality Date  . APPENDECTOMY     "teenager"  . FRACTURE SURGERY Right    "compound fracture of leg per pt"  . HARDWARE REMOVAL     a month after fracture surgery  . THYROIDECTOMY N/A 10/08/2013   Procedure: TOTAL THYROIDECTOMY;  Surgeon: Velora Heckler, MD;  Location: [facility] ORS;  Service: General;  Laterality: N/A;  . TONSILLECTOMY AND ADENOIDECTOMY  2012     OB History   No  obstetric history on file.      Home Medications    Prior to Admission medications   Medication Sig Start Date End Date Taking? Authorizing Provider  dextromethorphan (DELSYM) 30 MG/5ML liquid Take 30 mg by mouth as needed for cough.    [provider]  famotidine (PEPCID) 20 MG tablet Take 1 tablet (20 mg total) by mouth 2 (two) times daily. Patient not taking: Reported on 07/11/2018 04/26/18   Arby Barrette, MD  ferrous sulfate 325 (65 FE) MG tablet Take 325 mg by mouth every other day.     [provider]  fluticasone (FLONASE) 50 MCG/ACT nasal spray Place 2 sprays into both nostrils daily for 5 days. 10/16/18 10/21/18  Claude Manges, PA-C  hydrocortisone 2.5 % lotion Apply topically 2 (two) times daily. 01/17/19   Jabes Primo, Waylan Boga, PA-C  levothyroxine (SYNTHROID, LEVOTHROID) 175 MCG tablet Take 1 tablet (175 mcg total) by mouth daily before breakfast. 01/12/19   Carlus Pavlov, MD  loratadine (CLARITIN) 10 MG tablet Take 1 tablet (10 mg total) by mouth daily for 5 days. 10/16/18 10/21/18  Claude Manges, PA-C    Family History Family History  Problem Relation Age of Onset  . Thyroid disease Mother        "  had thyroid removed due to nodules per pt"  . Hyperlipidemia Father   . Hypertension Father   . Diabetes Father   . Cancer Paternal Grandmother        lung  . Hyperlipidemia Paternal Grandmother   . Hypertension Paternal Grandmother   . Diabetes Paternal Grandmother     Social History Social History   Tobacco Use  . Smoking status: Never Smoker  . Smokeless tobacco: Never Used  Substance Use Topics  . Alcohol use: Yes    Comment: occasional  . Drug use: No     Allergies   Penicillins   Review of Systems Review of Systems  Constitutional: Negative for fever.  Musculoskeletal: Positive for arthralgias.  Skin: Positive for rash.  Neurological: Negative for numbness.     Physical Exam Updated Vital Signs BP 122/74 (BP Location: Left Arm)    Pulse 76   Temp 98 F (36.7 C) (Oral)   Resp 19   Ht  (1.727 m)   Wt 127 kg   LMP 01/16/2019 (Exact Date)   SpO2 100%   BMI 42.57 kg/m   Physical Exam Vitals signs and nursing note reviewed.  Constitutional:      General: She is not in acute distress.    Appearance: She is well-developed. She is obese. She is not diaphoretic.  HENT:     Head: Normocephalic and atraumatic.     Mouth/Throat:     Pharynx: No oropharyngeal exudate.  Eyes:     General: No scleral icterus.       Right eye: No discharge.        Left eye: No discharge.     Conjunctiva/sclera: Conjunctivae normal.     Pupils: Pupils are equal, round, and reactive to light.  Neck:     Musculoskeletal: Normal range of motion and neck supple.     Thyroid: No thyromegaly.  Cardiovascular:     Rate and Rhythm: Normal rate and regular rhythm.     Heart sounds: Normal heart sounds. No murmur. No friction rub. No gallop.   Pulmonary:     Effort: Pulmonary effort is normal. No respiratory distress.     Breath sounds: Normal breath sounds. No stridor. No wheezing or rales.  Musculoskeletal:     Comments: Mild edema noted to the left foot and ankle, however could be baseline; no tenderness to palpation on the foot or ankle  Lymphadenopathy:     Cervical: No cervical adenopathy.  Skin:    General: Skin is warm and dry.     Coloration: Skin is not pale.     Findings: No rash.     Comments: There is a smooth, nickel sized area of darker pigment on the right radial wrist, there is no erythema or texture, no tenderness  Neurological:     Mental Status: She is alert.     Coordination: Coordination normal.      ED Treatments / Results  Labs (all labs ordered are listed, but only abnormal results are displayed) Labs Reviewed - No data to display  EKG None  Radiology Dg Foot Complete Left  Result Date: 01/17/2019 CLINICAL DATA:  Left foot pain/injury EXAM: LEFT FOOT - COMPLETE 3+ VIEW COMPARISON:  None.  FINDINGS: No fracture or dislocation is seen. The joint spaces are preserved. Mild soft tissue swelling along the dorsal forefoot. IMPRESSION: No fracture or dislocation is seen. Electronically Signed   By: Charline Bills M.D.   On: 01/17/2019 11:45    Procedures  Procedures (including critical care time)  Medications Ordered in ED Medications - No data to display   Initial Impression / Assessment and Plan / ED Course  I have reviewed the triage vital signs and the nursing notes.  Pertinent labs & imaging results that were available during my care of the patient were reviewed by me and considered in my medical decision making (see chart for details).        Patient with probable contact dermatitis versus friction related skin lesion on the right wrist.  There are no signs of infection.  Suspect related to the patient's gloves.  Will try hydrocortisone cream.  Will give follow-up to dermatology if not improving.  X-ray conducted to assess for stress fracture, which was negative.  Suspect sprain/strain.  Patient referred to podiatry if symptoms not improving.  Ice and elevation discussed.  NSAIDs and Tylenol discussed for pain.  Patient will be given a ASO brace for support.  Return precautions discussed.  Patient understands and agrees with plan.  Patient vital stable throughout ED course and discharged in satisfactory condition.  Final Clinical Impressions(s) / ED Diagnoses   Final diagnoses:  Rash and nonspecific skin eruption  Left foot pain    ED Discharge Orders         Ordered    hydrocortisone 2.5 % lotion  2 times daily     01/17/19 [address], [place], [place] 01/17/19 1157    Terrilee Files, MD 01/18/19 0700

## 2019-01-17 NOTE — Discharge Instructions (Signed)
You can alternate ibuprofen and acetaminophen as prescribed at the counter, as needed for pain.  Use ice 3-4 times daily alternating 20 minutes on, 20 minutes off.  Elevate your foot whenever you are not walking on it.  Wear brace for support as much as possible.  Apply hydrocortisone cream to your rash twice daily as prescribed.  If it is not improving, you can follow-up with Dr. Margo Aye, dermatologist, for further evaluation and treatment of your symptoms.  If your foot pain is not improving, please follow-up with the podiatrist, Dr. Logan Bores.

## 2019-01-28 ENCOUNTER — Ambulatory Visit: Payer: Self-pay | Admitting: Podiatry

## 2019-02-01 ENCOUNTER — Encounter: Payer: Self-pay | Admitting: Podiatry

## 2019-02-01 ENCOUNTER — Ambulatory Visit: Payer: Self-pay | Admitting: Podiatry

## 2019-02-01 ENCOUNTER — Other Ambulatory Visit: Payer: Self-pay

## 2019-02-01 ENCOUNTER — Ambulatory Visit (INDEPENDENT_AMBULATORY_CARE_PROVIDER_SITE_OTHER): Payer: Self-pay | Admitting: Podiatry

## 2019-02-01 VITALS — BP 144/69 | HR 84 | Temp 97.0°F | Resp 16

## 2019-02-01 DIAGNOSIS — M659 Synovitis and tenosynovitis, unspecified: Secondary | ICD-10-CM

## 2019-02-01 DIAGNOSIS — M76822 Posterior tibial tendinitis, left leg: Secondary | ICD-10-CM

## 2019-02-01 MED ORDER — METHYLPREDNISOLONE 4 MG PO TBPK: ORAL_TABLET | ORAL | 0 refills | Status: DC

## 2019-02-01 MED ORDER — MELOXICAM 15 MG PO TABS: 15.0000 mg | ORAL_TABLET | Freq: Every day | ORAL | 1 refills | Status: DC

## 2019-02-01 NOTE — Patient Instructions (Signed)

## 2019-02-01 NOTE — Progress Notes (Signed)
   Subjective:    Patient ID: Diamond [name], female    DOB: Oct 28, 1986, 33 y.o.   MRN: [id number]  HPI    Review of Systems  Musculoskeletal: Positive for arthralgias and myalgias.  All other systems reviewed and are negative.      Objective:   Physical Exam        Assessment & Plan:

## 2019-02-01 NOTE — Progress Notes (Signed)
Screened for COVID-19 symptoms, all negative 

## 2019-02-01 NOTE — Progress Notes (Signed)
   HPI: 33 year old female presents the office today for evaluation of left foot and ankle pain.  Patient works at [employer] at [employer] center and is on her feet for 12-hour shifts.  Patient notices significant pain and tenderness during work and also in the mornings getting out of bed.  She went to [facility] long emergency department on 01/17/2019 and she was given a brace for treatment.  Brace is helping minimally.  She is taking Motrin as needed pain.  She presents for further treatment and evaluation  Past Medical History:  Diagnosis Date  . Anemia   . History of chicken pox   . Multiple thyroid nodules 04/2013   3 nodules per pt  . Pneumonia    hx of as child     Physical Exam: General: The patient is alert and oriented x3 in no acute distress.  Dermatology: Skin is warm, dry and supple bilateral lower extremities. Negative for open lesions or macerations.  Vascular: Palpable pedal pulses bilaterally. No edema or erythema noted. Capillary refill within normal limits.  Neurological: Epicritic and protective threshold grossly intact bilaterally.   Musculoskeletal Exam: Range of motion within normal limits to all pedal and ankle joints bilateral. Muscle strength 5/5 in all groups bilateral.  Tenderness to palpation noted to the medial aspect of the left ankle joint.  Pain on palpation also noted along the distal portion of the posterior tibial tendon as it enters the medial longitudinal arch of the foot.  Assessment: 1.  Synovitis of left ankle 2.  Posterior tibial tendinitis left   Plan of Care:  1. Patient evaluated.   2.  Injection of 0.5 cc Celestone Soluspan injection the left ankle joint. 3.  Injection of 0.5 cc Celestone Soluspan injected into the posterior tibial tendon sheath as it enters the medial longitudinal arch of the foot 4.  Prescription for Medrol Dosepak 5.  Prescription for meloxicam daily after completing the Medrol Dosepak 6.  Plantar fascial brace dispensed.  7.  I also discussed custom molded orthotics with the patient.  Once the patient's insurance goes through for her work she should consider custom molded insoles as a long-term solution 8.  Note for work provided today.  No working longer than 10-hour shifts x4 weeks 9.  Return to clinic in 4 weeks      Felecia Shelling, DPM [facility]  Dr. Felecia Shelling, DPM    [address] [place], [place] [address]                Office [phone]  Fax [phone]

## 2019-02-02 ENCOUNTER — Telehealth: Payer: Self-pay | Admitting: Internal Medicine

## 2019-02-02 ENCOUNTER — Ambulatory Visit: Payer: Self-pay | Admitting: *Deleted

## 2019-02-02 NOTE — Telephone Encounter (Signed)
error 

## 2019-02-02 NOTE — Telephone Encounter (Signed)
Patient is having cough and SOB. Patient is requesting sick leave from work due to her cough. Cough is occurring mainly at night- almost 1 week.  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  Patient triaged and advised she can do E-visit at [email or web address] or be seen at [facility] for her symptoms. Patient advised a letter can not be written unless a provider has a visit with her. Patient will do E- visit.  Reason for Disposition . [1] MILD difficulty breathing (e.g., minimal/no SOB at rest, SOB with walking, pulse <100) AND [2] NEW-onset or WORSE than normal  Answer Assessment - Initial Assessment Questions 1. RESPIRATORY STATUS: "Describe your breathing?" (e.g., wheezing, shortness of breath, unable to speak, severe coughing)      Shortness of breath with exertion 2. ONSET: "When did this breathing problem begin?"      1 week 3. PATTERN "Does the difficult breathing come and go, or has it been constant since it started?"      Difficultly with breathing is worse when moving around 4. SEVERITY: "How bad is your breathing?" (e.g., mild, moderate, severe)    - MILD: No SOB at rest, mild SOB with walking, speaks normally in sentences, can lay down, no retractions, pulse < 100.    - MODERATE: SOB at rest, SOB with minimal exertion and prefers to sit, cannot lie down flat, speaks in phrases, mild retractions, audible wheezing, pulse 100-120.    - SEVERE: Very SOB at rest, speaks in single words, struggling to breathe, sitting hunched forward, retractions, pulse > 120      mild 5. RECURRENT SYMPTOM: "Have you had difficulty breathing before?" If so, ask: "When was the last time?" and "What happened that time?"      Patient has allergies- she has had difficulty before- she is using Flonase- but it is not helping 6. CARDIAC HISTORY: "Do you have any history of heart disease?" (e.g., heart attack, angina, bypass surgery, angioplasty)   no 7. LUNG HISTORY: "Do you have any history of lung disease?"  (e.g., pulmonary embolus, asthma, emphysema)     No- pneumonia hx 2 years ago 8. CAUSE: "What do you think is causing the breathing problem?"      Allergy vs viral 9. OTHER SYMPTOMS: "Do you have any other symptoms? (e.g., dizziness, runny nose, cough, chest pain, fever)     Runny nose 10. PREGNANCY: "Is there any chance you are pregnant?" "When was your last menstrual period?"       No- LMP- 2 weeks ago 11. TRAVEL: "Have you traveled out of the country in the last month?" (e.g., travel history, exposures)       No travel, no known exposure  Protocols used: BREATHING DIFFICULTY-A-AH

## 2019-02-03 ENCOUNTER — Emergency Department (HOSPITAL_COMMUNITY)
Admission: EM | Admit: 2019-02-03 | Discharge: 2019-02-03 | Disposition: A | Discharge status: Home / Self Care | Payer: Self-pay | Attending: Emergency Medicine | Admitting: Emergency Medicine

## 2019-02-03 ENCOUNTER — Encounter (HOSPITAL_COMMUNITY): Payer: Self-pay | Admitting: Student

## 2019-02-03 ENCOUNTER — Other Ambulatory Visit: Payer: Self-pay

## 2019-02-03 ENCOUNTER — Emergency Department (HOSPITAL_COMMUNITY): Payer: Self-pay

## 2019-02-03 DIAGNOSIS — Z79899 Other long term (current) drug therapy: Secondary | ICD-10-CM | POA: Insufficient documentation

## 2019-02-03 DIAGNOSIS — R0602 Shortness of breath: Secondary | ICD-10-CM | POA: Insufficient documentation

## 2019-02-03 DIAGNOSIS — E039 Hypothyroidism, unspecified: Secondary | ICD-10-CM | POA: Insufficient documentation

## 2019-02-03 LAB — BASIC METABOLIC PANEL
Anion gap: 7 (ref 5–15)
BUN: 15 mg/dL (ref 6–20)
CO2: 24 mmol/L (ref 22–32)
Calcium: 9.1 mg/dL (ref 8.9–10.3)
Chloride: 108 mmol/L (ref 98–111)
Creatinine, Ser: 0.7 mg/dL (ref 0.44–1.00)
GFR calc Af Amer: >60 mL/min (ref >60)
GFR calc non Af Amer: >60 mL/min (ref >60)
Glucose, Bld: 133 mg/dL — ABNORMAL HIGH (ref 70–99)
Potassium: 3.9 mmol/L (ref 3.5–5.1)
Sodium: 139 mmol/L (ref 135–145)

## 2019-02-03 LAB — CBC
HCT: 34.9 % — ABNORMAL LOW (ref 36.0–46.0)
Hemoglobin: 10.2 g/dL — ABNORMAL LOW (ref 12.0–15.0)
MCH: 24.5 pg — ABNORMAL LOW (ref 26.0–34.0)
MCHC: 29.2 g/dL — ABNORMAL LOW (ref 30.0–36.0)
MCV: 83.7 fL (ref 80.0–100.0)
Platelets: 385 10*3/uL (ref 150–400)
RBC: 4.17 MIL/uL (ref 3.87–5.11)
RDW: 16.5 % — ABNORMAL HIGH (ref 11.5–15.5)
WBC: 11.5 10*3/uL — ABNORMAL HIGH (ref 4.0–10.5)
nRBC: 0 % (ref 0.0–0.2)

## 2019-02-03 LAB — D-DIMER, QUANTITATIVE: D-Dimer, Quant: 0.3 ug/mL-FEU (ref 0.00–0.50)

## 2019-02-03 LAB — I-STAT BETA HCG BLOOD, ED (MC, WL, AP ONLY): I-stat hCG, quantitative: <5 m[IU]/mL (ref <5)

## 2019-02-03 LAB — TROPONIN I
Troponin I: <0.03 ng/mL (ref <0.03)
Troponin I: <0.03 ng/mL (ref <0.03)

## 2019-02-03 LAB — BRAIN NATRIURETIC PEPTIDE: B Natriuretic Peptide: 26.2 pg/mL (ref 0.0–100.0)

## 2019-02-03 MED ORDER — BENZONATATE 100 MG PO CAPS: 100.0000 mg | ORAL_CAPSULE | Freq: Three times a day (TID) | ORAL | 0 refills | Status: DC

## 2019-02-03 MED ORDER — CETIRIZINE HCL 10 MG PO TABS: 10.0000 mg | ORAL_TABLET | Freq: Every day | ORAL | 0 refills | Status: DC

## 2019-02-03 MED ORDER — FLUTICASONE PROPIONATE 50 MCG/ACT NA SUSP: 1.0000 | Freq: Every day | NASAL | 0 refills | Status: DC

## 2019-02-03 NOTE — ED Notes (Signed)
Ambulated pt o2 stat to begin with was 97% heart rate was 92, pt did not shoe any distress, pt denies dizziness.  Ending o2 stat 95% and heart rate 105

## 2019-02-03 NOTE — ED Triage Notes (Addendum)
Pt SHOB x1 week, especially at night.  Runny nose, productive cough (more at night).  Pt reports shob worse with exertion.  Denies CP, fever, n/v.  Pt able to speak in complete sentences.

## 2019-02-03 NOTE — ED Notes (Signed)
Discharge paperwork reviewed with pt, pt oxygen levels WNL, no complaints.  Pt without questions or concerns, ambulatory at discharge.

## 2019-02-03 NOTE — Discharge Instructions (Addendum)
You were seen in the emergency department today for chest pain. Your work-up in the emergency department has been overall reassuring. Your labs have been fairly normal and or similar to previous blood work you have had done. Your EKG and the enzyme we use to check your heart did not show an acute heart attack at this time. Your chest x-ray showed that your heart is somewhat enlarged, this has been on prior chest xrays- we would like you to discuss this with your primary care, you may need to have an echocardiogram to further evaluate this.     You were seen in the emergency today for upper respiratory symptoms, we suspect your symptoms are related to allergies or a virus at this time.  We gave prescribed you medicines to help with this:  -Flonase to be used 1 spray in each nostril daily.  This medication is used to treat your congestion.  -Tessalon can be taken once every 8 hours as needed.  This medication is used to treat your cough.  -Zyrtec- allergy medicine to be taken daily.   We have prescribed you new medication(s) today. Discuss the medications prescribed today with your pharmacist as they can have adverse effects and interactions with your other medicines including over the counter and prescribed medications. Seek medical evaluation if you start to experience new or abnormal symptoms after taking one of these medicines, seek care immediately if you start to experience difficulty breathing, feeling of your throat closing, facial swelling, or rash as these could be indications of a more serious allergic reaction  We have limited coronavirus testing at this time. We are instructing patient's with cough to quarantine themselves for 14 days. You may be able to discontinue self quarantine if the following conditions are met:   Persons with COVID-19 who have symptoms and were directed to care for themselves at home may discontinue home isolation under the  following conditions: - It has been at  least 7 days have passed since symptoms first appeared. - AND at least 3 days (72 hours) have passed since recovery defined as resolution of fever without the use of fever-reducing medications and improvement in respiratory symptoms (e.g., cough, shortness of breath)  Please follow the below quarantine instructions.   Please follow up with primary care within 3-5 days for re-evaluation- call prior to going to the office to make them aware of your symptoms. Return to the ER for new or worsening symptoms including but not limited to increased work of breathing, fever, chest pain, passing out, or any other concerns.    Stay home except to get medical care You should restrict activities outside your home, except for getting medical care. Do not go to work, school, or public areas, and do not use public transportation or taxis.  Call ahead before visiting your doctor Before your medical appointment, call the healthcare provider and tell them that you have, or are being evaluated for, COVID-19 infection. This will help the healthcare providers office take steps to keep other people from getting infected. Ask your healthcare provider to call the local or state health department.  Monitor your symptoms Seek prompt medical attention if your illness is worsening (e.g., difficulty breathing). Before going to your medical appointment, call the healthcare provider and tell them that you have, or are being evaluated for, COVID-19 infection. Ask your healthcare provider to call the local or state health department.  Wear a facemask You should wear a facemask that covers your nose and mouth  when you are in the same room with other people and when you visit a healthcare provider. People who live with or visit you should also wear a facemask while they are in the same room with you.  Separate yourself from other people in your home As much as possible, you should stay in a different room from other people  in your home. Also, you should use a separate bathroom, if available.  Avoid sharing household items You should not share dishes, drinking glasses, cups, eating utensils, towels, bedding, or other items with other people in your home. After using these items, you should wash them thoroughly with soap and water.  Cover your coughs and sneezes Cover your mouth and nose with a tissue when you cough or sneeze, or you can cough or sneeze into your sleeve. Throw used tissues in a lined trash can, and immediately wash your hands with soap and water for at least 20 seconds or use an alcohol-based hand rub.  Wash your [employer] [employer] your hands often and thoroughly with soap and water for at least 20 seconds. You can use an alcohol-based hand sanitizer if soap and water are not available and if your hands are not visibly dirty. Avoid touching your eyes, nose, and mouth with unwashed hands.   Prevention Steps for Caregivers and Household Members of Individuals Confirmed to have, or Being Evaluated for, COVID-19 Infection Being Cared for in the Home  If you live with, or provide care at home for, a person confirmed to have, or being evaluated for, COVID-19 infection please follow these guidelines to prevent infection:  Follow healthcare providers instructions Make sure that you understand and can help the patient follow any healthcare provider instructions for all care.  Provide for the patients basic needs You should help the patient with basic needs in the home and provide support for getting groceries, prescriptions, and other personal needs.  Monitor the patients symptoms If they are getting sicker, call his or her medical provider and tell them that the patient has, or is being evaluated for, COVID-19 infection. This will help the healthcare providers office take steps to keep other people from getting infected. Ask the healthcare provider to call the local or state health  department.  Limit the number of people who have contact with the patient If possible, have only one caregiver for the patient. Other household members should stay in another home or place of residence. If this is not possible, they should stay in another room, or be separated from the patient as much as possible. Use a separate bathroom, if available. Restrict visitors who do not have an essential need to be in the home.  Keep older adults, very young children, and other sick people away from the patient Keep older adults, very young children, and those who have compromised immune systems or chronic health conditions away from the patient. This includes people with chronic heart, lung, or kidney conditions, diabetes, and cancer.  Ensure good ventilation Make sure that shared spaces in the home have good air flow, such as from an air conditioner or an opened window, weather permitting.  Wash your hands often Wash your hands often and thoroughly with soap and water for at least 20 seconds. You can use an alcohol based hand sanitizer if soap and water are not available and if your hands are not visibly dirty. Avoid touching your eyes, nose, and mouth with unwashed hands. Use disposable paper towels to dry your hands. If not  available, use dedicated cloth towels and replace them when they become wet.  Wear a facemask and gloves Wear a disposable facemask at all times in the room and gloves when you touch or have contact with the patients blood, body fluids, and/or secretions or excretions, such as sweat, saliva, sputum, nasal mucus, vomit, urine, or feces.  Ensure the mask fits over your nose and mouth tightly, and do not touch it during use. Throw out disposable facemasks and gloves after using them. Do not reuse. Wash your hands immediately after removing your facemask and gloves. If your personal clothing becomes contaminated, carefully remove clothing and launder. Wash your hands after  handling contaminated clothing. Place all used disposable facemasks, gloves, and other waste in a lined container before disposing them with other household waste. Remove gloves and wash your hands immediately after handling these items.  Do not share dishes, glasses, or other household items with the patient Avoid sharing household items. You should not share dishes, drinking glasses, cups, eating utensils, towels, bedding, or other items with a patient who is confirmed to have, or being evaluated for, COVID-19 infection. After the person uses these items, you should wash them thoroughly with soap and water.  Wash laundry thoroughly Immediately remove and wash clothes or bedding that have blood, body fluids, and/or secretions or excretions, such as sweat, saliva, sputum, nasal mucus, vomit, urine, or feces, on them. Wear gloves when handling laundry from the patient. Read and follow directions on labels of laundry or clothing items and detergent. In general, wash and dry with the warmest temperatures recommended on the label.  Clean all areas the individual has used often Clean all touchable surfaces, such as counters, tabletops, doorknobs, bathroom fixtures, toilets, phones, keyboards, tablets, and bedside tables, every day. Also, clean any surfaces that may have blood, body fluids, and/or secretions or excretions on them. Wear gloves when cleaning surfaces the patient has come in contact with. Use a diluted bleach solution (e.g., dilute bleach with 1 part bleach and 10 parts water) or a household disinfectant with a label that says EPA-registered for coronaviruses. To make a bleach solution at home, add 1 tablespoon of bleach to 1 quart (4 cups) of water. For a larger supply, add  cup of bleach to 1 gallon (16 cups) of water. Read labels of cleaning products and follow recommendations provided on product labels. Labels contain instructions for safe and effective use of the cleaning product  including precautions you should take when applying the product, such as wearing gloves or eye protection and making sure you have good ventilation during use of the product. Remove gloves and wash hands immediately after cleaning.  Monitor yourself for signs and symptoms of illness Caregivers and household members are considered close contacts, should monitor their health, and will be asked to limit movement outside of the home to the extent possible. Follow the monitoring steps for close contacts listed on the symptom monitoring form.   ? If you have additional questions, contact your local health department or call the epidemiologist on call at [phone] (available 24/7). ? This guidance is subject to change. For the most up-to-date guidance from [facility], please refer to their website: [email or web address]

## 2019-02-03 NOTE — ED Provider Notes (Signed)
[facility]-EMERGENCY DEPT Provider Note   CSN: [id number] Arrival date & time: 02/03/19  1058    History   Chief Complaint Chief Complaint  Patient presents with  . Shortness of Breath    HPI Diamond [name] is a 33 y.o. female with a history of hypothyroidism, prior pneumonia, and iron deficiency anemia who presents to the emergency department with complaint of shortness of breath over the past 1 week.  Patient states her dyspnea is fairly constant, it is worse with exertion and with laying flat, no alleviating factors.  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  Dyspnea is associated with cough that is intermittently productive of phlegm sputum, coughing is worse at night.  She also states she has some chest discomfort at times described as a sharp pain to the substernal chest is worse with coughing, a deep breath, and the supine position.  No other alleviating or aggravating factors.  No history of similar.  Has had some congestion as well. Has not had to sleep on additional pillows. Denies fever, chills, nausea, vomiting, diaphoresis, syncope, recent travel, or known COVID-19 exposures. She states she needs a detailed note to be able to return to work. Denies leg pain/swelling, hemoptysis, recent surgery/trauma, recent long travel, hormone use, personal hx of cancer, or hx of DVT/PE.      HPI  Past Medical History:  Diagnosis Date  . Anemia   . History of chicken pox   . Multiple thyroid nodules 04/2013   3 nodules per pt  . Pneumonia    hx of as child    Patient Active Problem List   Diagnosis Date Noted  . S/P thyroidectomy 12/08/2017  . Iron deficiency anemia 09/19/2016  . 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 Routine general medical examination at a health care facility 08/23/2013  . Cervical lesion  08/23/2013    Past Surgical History:  Procedure Laterality Date  . APPENDECTOMY     "teenager"  . FRACTURE SURGERY Right    "compound fracture of leg per pt"  . HARDWARE REMOVAL     a month after fracture surgery  . THYROIDECTOMY N/A 10/08/2013   Procedure: TOTAL THYROIDECTOMY;  Surgeon: Velora Heckler, MD;  Location: [facility] ORS;  Service: General;  Laterality: N/A;  . TONSILLECTOMY AND ADENOIDECTOMY  2012     OB History   No obstetric history on file.      Home Medications    Prior to Admission medications   Medication Sig Start Date End Date Taking? Authorizing Provider  ferrous sulfate 325 (65 FE) MG tablet Take 325 mg by mouth every other day.     [provider]  hydrocortisone 2.5 % lotion Apply topically 2 (two) times daily. 01/17/19   Law, Waylan Boga, PA-C  levothyroxine (SYNTHROID, LEVOTHROID) 175 MCG tablet Take 1 tablet (175 mcg total) by mouth daily before breakfast. 01/12/19   Carlus Pavlov, MD  meloxicam (MOBIC) 15 MG tablet Take 1 tablet (15 mg total) by mouth daily. 02/01/19   Felecia Shelling, DPM  methylPREDNISolone (MEDROL DOSEPAK) 4 MG TBPK tablet 6 day dose pack - take as directed 02/01/19   Felecia Shelling, DPM    Family History Family History  Problem Relation Age of Onset  . Thyroid disease Mother        " had thyroid removed due  to nodules per pt"  . Hyperlipidemia Father   . Hypertension Father   . Diabetes Father   . Cancer Paternal Grandmother        lung  . Hyperlipidemia Paternal Grandmother   . Hypertension Paternal Grandmother   . Diabetes Paternal Grandmother     Social History Social History   Tobacco Use  . Smoking status: Never Smoker  . Smokeless tobacco: Never Used  Substance Use Topics  . Alcohol use: Yes    Comment: occasional  . Drug use: No     Allergies   Penicillins   Review of Systems Review of Systems  Constitutional: Negative for chills, diaphoresis and fever.  HENT: Positive for congestion. Negative  for ear pain and sore throat.   Respiratory: Positive for cough and shortness of breath.   Cardiovascular: Positive for chest pain. Negative for palpitations and leg swelling.  Gastrointestinal: Negative for abdominal pain, nausea and vomiting.  Musculoskeletal: Negative for myalgias.  Neurological: Negative for dizziness, syncope and light-headedness.  All other systems reviewed and are negative.    Physical Exam Updated Vital Signs BP (!) 159/94 (BP Location: Left Arm)   Pulse 84   Temp 98 F (36.7 C) (Oral)   Resp 17   Ht 5\' 8"  (1.727 m)   Wt (!) 140.6 kg   LMP 01/16/2019 (Exact Date)   SpO2 99%   BMI 47.14 kg/m   Physical Exam Vitals signs and nursing note reviewed.  Constitutional:      General: She is not in acute distress.    Appearance: She is well-developed. She is obese.  HENT:     Head: Normocephalic and atraumatic.     Right Ear: Ear canal normal. Tympanic membrane is not perforated, erythematous, retracted or bulging.     Left Ear: Ear canal normal. Tympanic membrane is not perforated, erythematous, retracted or bulging.     Ears:     Comments: No mastoid erythema/swelling/tenderness.     Nose:     Right Sinus: No maxillary sinus tenderness or frontal sinus tenderness.     Left Sinus: No maxillary sinus tenderness or frontal sinus tenderness.     Mouth/Throat:     Pharynx: Uvula midline. No oropharyngeal exudate or posterior oropharyngeal erythema.     Comments: Posterior oropharynx is symmetric appearing. Patient tolerating own secretions without difficulty. No trismus. No drooling. No hot potato voice. No swelling beneath the tongue, submandibular compartment is soft.  Eyes:     General:        Right eye: No discharge.        Left eye: No discharge.     Conjunctiva/sclera: Conjunctivae normal.     Pupils: Pupils are equal, round, and reactive to light.  Neck:     Musculoskeletal: Normal range of motion and neck supple. No edema or neck rigidity.   Cardiovascular:     Rate and Rhythm: Normal rate and regular rhythm.     Pulses:          Radial pulses are 2+ on the right side and 2+ on the left side.     Heart sounds: No murmur.  Pulmonary:     Effort: Pulmonary effort is normal. No respiratory distress.     Breath sounds: Normal breath sounds. No wheezing, rhonchi or rales.  Chest:     Chest wall: Tenderness (sternal, mild, no crepitus or overlying skin changes.) present.  Abdominal:     General: There is no distension.     Palpations:  Abdomen is soft.     Tenderness: There is no abdominal tenderness ( ). There is no guarding or rebound.  Musculoskeletal:     Right lower leg: She exhibits no tenderness. No edema.     Left lower leg: She exhibits no tenderness. No edema.  Lymphadenopathy:     Cervical: No cervical adenopathy.  Skin:    General: Skin is warm and dry.     Findings: No rash.  Neurological:     General: No focal deficit present.     Mental Status: She is alert.  Psychiatric:        Behavior: Behavior normal.    ED Treatments / Results  Labs (all labs ordered are listed, but only abnormal results are displayed) Labs Reviewed  CBC - Abnormal; Notable for the following components:      Result Value   WBC 11.5 (*)    Hemoglobin 10.2 (*)    HCT 34.9 (*)    MCH 24.5 (*)    MCHC 29.2 (*)    RDW 16.5 (*)    All other components within normal limits  BASIC METABOLIC PANEL - Abnormal; Notable for the following components:   Glucose, Bld 133 (*)    All other components within normal limits  TROPONIN I  D-DIMER, QUANTITATIVE (NOT AT [facility])  BRAIN NATRIURETIC PEPTIDE  TROPONIN I  I-STAT BETA HCG BLOOD, ED ([facility], [facility], [facility] ONLY)    EKG EKG Interpretation  Date/Time:  Wednesday February 03 2019 12:15:20 EDT Ventricular Rate:  67 PR Interval:    QRS Duration: 96 QT Interval:  393 QTC Calculation: 415 R Axis:   8 Text Interpretation:  Sinus rhythm Borderline T wave abnormalities Baseline wander Abnormal ekg  Confirmed by Gerhard Munch [phone]) on 02/03/2019 12:22:07 PM   Radiology Dg Chest 2 View  Result Date: 02/03/2019 CLINICAL DATA:  Centralized chest pain EXAM: CHEST - 2 VIEW COMPARISON:  10/16/2018 FINDINGS: Cardiomegaly. No confluent airspace opacities, effusions or edema. No acute bony abnormality. IMPRESSION: Cardiomegaly.  No active disease. Electronically Signed   By: Charlett Nose M.D.   On: 02/03/2019 11:44    Procedures Procedures (including critical care time)  Medications Ordered in ED Medications - No data to display   Initial Impression / Assessment and Plan / ED Course  I have reviewed the triage vital signs and the nursing notes.  Pertinent labs & imaging results that were available during my care of the patient were reviewed by me and considered in my medical decision making (see chart for details).   Patient presents to the emergency department with complaints of dyspnea, cough, and chest discomfort.  Patient nontoxic-appearing, no apparent distress, vitals with elevated BP, low suspicion for HTN emergency.  Physical exam was overall unremarkable.  Patient heart with regular rate and rhythm, lungs clear to auscultation bilaterally, some reproducibility chest discomfort with anterior chest wall palpation.  DDX: ACS, PE, dissection, pneumothorax, pericarditis, pneumonia, viral respiratory illness, allergies, new onset CHF, anemia, MSK, electrolyte disturbance.  Plan for basic labs including d-dimer and troponin as well as EKG and chest x-ray.  Work-up reviewed: CBC: Nonspecific leukocytosis at 11.5.  Hemoglobin/hematocrit appear similar to prior and are actually improved today. BMP: No significant electrolyte disturbance.  Hyperglycemia at 133, PCP recheck. Preg test: Negative D-dimer: Negative. Delta troponin: Negative EKG: No STEMI CXR: Cardiomegaly.  No active disease.   Given patient's description of symptoms with her cardiomegaly on chest x-ray added on BNP which was  within normal limits.  With normal BNP, no pulmonary edema on CXR does not appear consistent with acute CHF exacerbation.  She has had cardiomegaly on prior chest x-rays therefore do feel she would benefit from outpatient echo which can be discussed with PCP.  Low risk heart score, delta troponin negative, doubt ACS.  Low risk Wells, d-dimer negative, doubt PE.  No widened mediastinum on chest x-ray, symmetric pulses, doubt dissection.  Chest x-ray without pneumothorax, effusion, edema, or pneumothorax. No friction rub on exam, no diffuse ST changes on EKG, doubt pericarditis. Overall reassuring work-up in the emergency department.  Unclear definitive etiology to patient's symptoms, possibly due to a virus, allergies, or body habitus.  Will trial Flonase, zyrtec, & Tessalon.  Considered COVID-19, patient without recent travel or known exposures, no fevers, does not meet current hospital criteria for testing, will quarantine for precautionary measures. Ambulatory SpO2 >/= 95%. Appears safe for discharge at this time. I discussed results, treatment plan, need for follow-up, and return precautions with the patient. Provided opportunity for questions, patient confirmed understanding and is in agreement with plan.   Findings and plan of care discussed with supervising physician Dr. Jeraldine Loots who is in agreement.   Diamond [name] was evaluated in Emergency Department on 02/03/2019 for the symptoms described in the history of present illness. He/she was evaluated in the context of the global COVID-19 pandemic, which necessitated consideration that the patient might be at risk for infection with the SARS-CoV-2 virus that causes COVID-19. Institutional protocols and algorithms that pertain to the evaluation of patients at risk for COVID-19 are in a state of rapid change based on information released by regulatory bodies including the CDC and federal and state organizations. These policies and algorithms were followed  during the patient's care in the ED.  Final Clinical Impressions(s) / ED Diagnoses   Final diagnoses:  Shortness of breath    ED Discharge Orders         Ordered    fluticasone (FLONASE) 50 MCG/ACT nasal spray  Daily     02/03/19 1520    benzonatate (TESSALON) 100 MG capsule  Every 8 hours     02/03/19 1520    cetirizine (ZYRTEC ALLERGY) 10 MG tablet  Daily     02/03/19 [address], [place], PA-C 02/03/19 1525    Gerhard Munch, MD 02/03/19 [phone]

## 2019-03-01 ENCOUNTER — Other Ambulatory Visit: Payer: Self-pay

## 2019-03-01 ENCOUNTER — Encounter: Payer: Self-pay | Admitting: Podiatry

## 2019-03-01 ENCOUNTER — Ambulatory Visit (INDEPENDENT_AMBULATORY_CARE_PROVIDER_SITE_OTHER): Payer: Self-pay | Admitting: Podiatry

## 2019-03-01 VITALS — Temp 97.2°F

## 2019-03-01 DIAGNOSIS — M76822 Posterior tibial tendinitis, left leg: Secondary | ICD-10-CM

## 2019-03-01 DIAGNOSIS — M659 Synovitis and tenosynovitis, unspecified: Secondary | ICD-10-CM

## 2019-03-01 MED ORDER — MELOXICAM 15 MG PO TABS: 15.0000 mg | ORAL_TABLET | Freq: Every day | ORAL | 1 refills | Status: DC

## 2019-03-01 NOTE — Progress Notes (Signed)
   HPI: Obese 33 year old female presents the office today for follow-up evaluation regarding left foot and ankle pain.  Patient states the injections helped significantly.  Also her reduced hours while working have also helped alleviate symptoms.  Patient works at [employer] and is normally on her feet for 12-13-hour shifts however we have reduced this to shifts no greater than 10 hours.  She presents for further treatment evaluation  Past Medical History:  Diagnosis Date  . Anemia   . History of chicken pox   . Multiple thyroid nodules 04/2013   3 nodules per pt  . Pneumonia    hx of as child     Physical Exam: General: The patient is alert and oriented x3 in no acute distress.  Dermatology: Skin is warm, dry and supple bilateral lower extremities. Negative for open lesions or macerations.  Vascular: Palpable pedal pulses bilaterally. No edema or erythema noted. Capillary refill within normal limits.  Neurological: Epicritic and protective threshold grossly intact bilaterally.   Musculoskeletal Exam: Range of motion within normal limits to all pedal and ankle joints bilateral. Muscle strength 5/5 in all groups bilateral.  There continues to be some pain on palpation to the medial and anterior aspects of the left ankle.  Also pain on palpation along the posterior tibial tendon as it enters the medial longitudinal arch of the foot.  Assessment: 1.  Synovitis left ankle-medial and anterior aspects 2.  Posterior tibial tendinitis left   Plan of Care:  1. Patient evaluated.  2.  Injection of 0.5 cc Celestone Soluspan injected into the left ankle joint and also along the posterior tibial tendon sheath as it enters the medial longitudinal arch of the foot 3.  Continue meloxicam daily.  Refill prescription provided 4.  Ankle brace dispensed today.  Recommend daily 5.  Note for work provided today.  Continue not working longer than 10-hour shifts x6 weeks.  6.  Patient told  me that she is still waiting to get insurance from work.  She is considering purchasing her own private insurance.  I do believe that custom molded orthotics would be greatly beneficial for the patient given her periods of prolonged standing while working on concrete floors.  .  Return to clinic in 6 weeks      Felecia Shelling, DPM [facility]  Dr. Felecia Shelling, DPM    [address] [place], [place] [address]                Office [phone]  Fax [phone]

## 2019-03-02 ENCOUNTER — Telehealth: Payer: Self-pay | Admitting: Family

## 2019-03-02 ENCOUNTER — Telehealth (INDEPENDENT_AMBULATORY_CARE_PROVIDER_SITE_OTHER): Payer: Self-pay | Admitting: Family

## 2019-03-02 DIAGNOSIS — I517 Cardiomegaly: Secondary | ICD-10-CM

## 2019-03-02 NOTE — Progress Notes (Signed)
Virtual Visit via Video Note  I connected with Diamond [name] on 03/02/19 at  2:00 PM EDT by a video enabled telemedicine application and verified that I am speaking with the correct person using two identifiers. This visit type was conducted due to national recommendations for restrictions regarding the COVID-19 Pandemic (e.g. social distancing).  This format is felt to be most appropriate for this patient at this time.   I discussed the limitations of evaluation and management by telemedicine and the availability of in person appointments. The patient expressed understanding and agreed to proceed.  Only the patient and myself were on today's video visit. The patient was at home and I was in my office at the time of today's visit.   History of Present Illness:  Patient presented to the ED on 02/03/19 with chief complaint of SOB. ED record is reviewed.  ED workup included CBC which showed mild leukocytosis and a hgb of 10.2, CXR which noted cardiomegaly.  Normal BNP, negative D dimer, Neg troponin.   She was given a trial of flonase, zyrtec and tessalon. She reports that symptoms resolved with these medications.   Reports that with the above medication.   Obesity- Reports weight 295 this AM. 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 Also reports that she is doing "herbal life."  Weight is down 15 pounds.   Wt Readings from Last 3 Encounters:  02/03/19 (!) 310 lb (140.6 kg)  01/17/19 280 lb (127 kg)  12/08/18 (!) 314 lb (142.4 kg)   BP Readings from Last 3 Encounters:  02/03/19 130/79  02/01/19 (!) 144/69  01/17/19 127/82    Observations/Objective:  Gen: Awake, alert, no acute distress Resp: Breathing is even and non-labored Psych: calm/pleasant demeanor Neuro: Alert and Oriented x 3, + facial symmetry, speech is clear.   Assessment and Plan:  Cardiomegaly- the majority of her blood pressures look stable.  I suggested that we obtain an echo when she is able for further assessment. She is currently without insurance and is unable to afford the test out of pocket but she is working on trying to obtain a short term policy until she receives insurance from her employer this winter. Advised her to call if she develops LE edema or recurrent SOB and she verbalizes understanding.   Morbid obesity- discussed importance of continued weight loss for her overall health including heart health and blood pressure control.     Follow Up Instructions:    I discussed the assessment and treatment plan with the patient. The patient was provided an opportunity to ask questions and all were answered. The patient agreed with the plan and demonstrated an understanding of the instructions.   The patient was advised to call back or seek an in-person evaluation if the symptoms worsen or if the condition fails to improve as anticipated.    Lemont Fillers, NP

## 2019-03-02 NOTE — Telephone Encounter (Signed)
Please advise patient that I reviewed her most recent ED visit for shortness of breath. There were some findings of enlarged heart on her chest x-ray and they recommended some additional heart work up. Please contact pt to schedule a follow up virtual visit.

## 2019-03-02 NOTE — Telephone Encounter (Signed)
Scheduled for today at 2pm

## 2019-03-18 ENCOUNTER — Emergency Department (HOSPITAL_COMMUNITY)
Admission: EM | Admit: 2019-03-18 | Discharge: 2019-03-18 | Disposition: A | Discharge status: Home / Self Care | Payer: Self-pay | Attending: Emergency Medicine | Admitting: Emergency Medicine

## 2019-03-18 ENCOUNTER — Encounter (HOSPITAL_COMMUNITY): Payer: Self-pay

## 2019-03-18 ENCOUNTER — Other Ambulatory Visit: Payer: Self-pay

## 2019-03-18 DIAGNOSIS — N611 Abscess of the breast and nipple: Secondary | ICD-10-CM | POA: Insufficient documentation

## 2019-03-18 DIAGNOSIS — Z79899 Other long term (current) drug therapy: Secondary | ICD-10-CM | POA: Insufficient documentation

## 2019-03-18 DIAGNOSIS — E039 Hypothyroidism, unspecified: Secondary | ICD-10-CM | POA: Insufficient documentation

## 2019-03-18 MED ORDER — CLINDAMYCIN HCL 150 MG PO CAPS: 450.0000 mg | ORAL_CAPSULE | Freq: Three times a day (TID) | ORAL | 0 refills | Status: DC

## 2019-03-18 NOTE — ED Provider Notes (Signed)
[facility]   CSN: [id number] Arrival date & time: 03/18/19  1108    History   Chief Complaint No chief complaint on file.   HPI Diamond [name] is a 33 y.o. female.     33 yo F with a chief complaint of a bump to her right breast.  She has had a small bump on there for a while but started having worsening over the past 3 to 4 days.  Red and warm.  Feels like the prior boils she has had.  Denies fevers or chills denies nausea vomiting.  The history is provided by the patient.  Illness  Severity:  Mild Onset quality:  Sudden Duration:  4 days Timing:  Constant Progression:  Worsening Chronicity:  Recurrent Associated symptoms: no chest pain, no congestion, no fever, no headaches, no myalgias, no nausea, no rhinorrhea, no shortness of breath, no vomiting and no wheezing     Past Medical History:  Diagnosis Date  . Anemia   . History of chicken pox   . Multiple thyroid nodules 04/2013   3 nodules per pt  . Pneumonia    hx of as child    Patient Active Problem List   Diagnosis Date Noted  . S/P thyroidectomy 12/08/2017  . Iron deficiency anemia 09/19/2016  . 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 Routine general medical examination at a health care facility 08/23/2013  . Cervical lesion 08/23/2013    Past Surgical History:  Procedure Laterality Date  . APPENDECTOMY     "teenager"  . FRACTURE SURGERY Right    "compound fracture of leg per pt"  . HARDWARE REMOVAL     a month after fracture surgery  . THYROIDECTOMY N/A 10/08/2013   Procedure: TOTAL THYROIDECTOMY;  Surgeon: Velora Heckler, MD;  Location: [facility];  Service: General;  Laterality: N/A;  . TONSILLECTOMY AND ADENOIDECTOMY  2012     OB History   No obstetric history on file.      Home Medications    Prior to Admission medications   Medication Sig Start Date End Date Taking? Authorizing Provider  cetirizine (ZYRTEC ALLERGY) 10 MG tablet  Take 1 tablet (10 mg total) by mouth daily. 02/03/19   Petrucelli, Samantha R, PA-C  clindamycin (CLEOCIN) 150 MG capsule Take 3 capsules (450 mg total) by mouth 3 (three) times daily. 03/18/19   Melene Plan, DO  ferrous sulfate 325 (65 FE) MG tablet Take 325 mg by mouth every other day.     [provider]  fluticasone (FLONASE) 50 MCG/ACT nasal spray Place 1 spray into both nostrils daily. 02/03/19   Petrucelli, Samantha R, PA-C  hydrocortisone 2.5 % lotion Apply topically 2 (two) times daily. 01/17/19   Law, Waylan Boga, PA-C  levothyroxine (SYNTHROID, LEVOTHROID) 175 MCG tablet Take 1 tablet (175 mcg total) by mouth daily before breakfast. 01/12/19   Carlus Pavlov, MD  meloxicam (MOBIC) 15 MG tablet Take 1 tablet (15 mg total) by mouth daily. 03/01/19   Felecia Shelling, DPM  Pseudoephedrine-APAP-DM (DAYQUIL PO) Take by mouth.    [provider]    Family History Family History  Problem Relation Age of Onset  . Thyroid disease Mother        " had thyroid removed due to nodules per pt"  . Hyperlipidemia Father   . Hypertension Father   . Diabetes Father   . Cancer Paternal Grandmother        lung  . Hyperlipidemia Paternal Grandmother   .  Hypertension Paternal Grandmother   . Diabetes Paternal Grandmother     Social History Social History   Tobacco Use  . Smoking status: Never Smoker  . Smokeless tobacco: Never Used  Substance Use Topics  . Alcohol use: Yes    Comment: occasional  . Drug use: No     Allergies   Penicillins   Review of Systems Review of Systems  Constitutional: Negative for chills and fever.  HENT: Negative for congestion and rhinorrhea.   Eyes: Negative for redness and visual disturbance.  Respiratory: Negative for shortness of breath and wheezing.   Cardiovascular: Negative for chest pain and palpitations.  Gastrointestinal: Negative for nausea and vomiting.  Genitourinary: Negative for dysuria and urgency.  Musculoskeletal: Negative  for arthralgias and myalgias.  Skin: Positive for color change. Negative for pallor and wound.  Neurological: Negative for dizziness and headaches.     Physical Exam Updated Vital Signs BP (!) 151/82   Pulse 90   Temp 98.7 F (37.1 C) (Oral)   Resp 18   Ht 5\' 8"  (1.727 m)   Wt (!) 140.6 kg   LMP 03/16/2019   SpO2 100%   BMI 47.14 kg/m   Physical Exam Vitals signs and nursing note reviewed.  Constitutional:      General: She is not in acute distress.    Appearance: She is well-developed. She is not diaphoretic.  HENT:     Head: Normocephalic and atraumatic.  Eyes:     Pupils: Pupils are equal, round, and reactive to light.  Neck:     Musculoskeletal: Normal range of motion and neck supple.  Cardiovascular:     Rate and Rhythm: Normal rate and regular rhythm.     Heart sounds: No murmur. No friction rub. No gallop.   Pulmonary:     Effort: Pulmonary effort is normal.     Breath sounds: No wheezing or rales.  Chest:    Abdominal:     General: There is no distension.     Palpations: Abdomen is soft.     Tenderness: There is no abdominal tenderness.  Musculoskeletal:        General: No tenderness.  Skin:    General: Skin is warm and dry.  Neurological:     Mental Status: She is alert and oriented to person, place, and time.  Psychiatric:        Behavior: Behavior normal.      ED Treatments / Results  Labs (all labs ordered are listed, but only abnormal results are displayed) Labs Reviewed - No data to display  EKG None  Radiology No results found.  Procedures Procedures (including critical care time)  Medications Ordered in ED Medications - No data to display   Initial Impression / Assessment and Plan / ED Course  I have reviewed the triage vital signs and the nursing notes.  Pertinent labs & imaging results that were available during my care of the patient were reviewed by me and considered in my medical decision making (see chart for details).         33 yo F with a chief complaint of possible breast abscess.  We will send the patient to the breast center for I&D.  Start on clindamycin.  1:12 PM:  I have discussed the diagnosis/risks/treatment options with the patient and family and believe the pt to be eligible for discharge home to follow-up with PCP. We also discussed returning to the ED immediately if new or worsening sx occur. We discussed  the sx which are most concerning (e.g., sudden worsening pain, fever, inability to tolerate by mouth) that necessitate immediate return. Medications administered to the patient during their visit and any new prescriptions provided to the patient are listed below.  Medications given during this visit Medications - No data to display   The patient appears reasonably screen and/or stabilized for discharge and I doubt any other medical condition or other [facility] requiring further screening, evaluation, or treatment in the ED at this time prior to discharge.    Final Clinical Impressions(s) / ED Diagnoses   Final diagnoses:  Breast abscess    ED Discharge Orders         Ordered    US BREAST ASPIRATION RIGHT     03/18/19 1139    clindamycin (CLEOCIN) 150 MG capsule  3 times daily     03/18/19 1142           Melene PlanFloyd, Thia Olesen, DO 03/18/19 1312

## 2019-03-18 NOTE — Discharge Instructions (Signed)
Call your PCP and let them know this is going on so they can keep track of your results.  If not the health and wellness center can act as your PCP for results. Return for rapid spread or fever.

## 2019-03-18 NOTE — ED Triage Notes (Addendum)
Patient arrived via [place].  C/O boil on right bottom of breast that has been growing.   Patient noticed it 2 weeks ago when it was very small.   Patient has tried heat compress and home remedies.   8/10 pain   A/Ox4 Ambulatory in triage.   Hx. Of boils

## 2019-03-22 ENCOUNTER — Telehealth: Payer: Self-pay | Admitting: Family

## 2019-03-22 NOTE — Telephone Encounter (Signed)
Lm for patient to call back and schedule virtual visit appointment for Wednesday or Friday.

## 2019-03-22 NOTE — Telephone Encounter (Signed)
Please contact pt to arrange a follow up video visit with me this week on Wednesday or Friday for ED follow up of breast mass.

## 2019-03-23 NOTE — Telephone Encounter (Signed)
Pt schedule mychart appt for 03-24-2019 at 12:20. Done

## 2019-03-24 ENCOUNTER — Telehealth (INDEPENDENT_AMBULATORY_CARE_PROVIDER_SITE_OTHER): Payer: Self-pay | Admitting: Family

## 2019-03-24 DIAGNOSIS — N611 Abscess of the breast and nipple: Secondary | ICD-10-CM

## 2019-03-24 DIAGNOSIS — I517 Cardiomegaly: Secondary | ICD-10-CM

## 2019-03-24 MED ORDER — CLINDAMYCIN HCL 150 MG PO CAPS: 450.0000 mg | ORAL_CAPSULE | Freq: Three times a day (TID) | ORAL | 0 refills | Status: DC

## 2019-03-24 NOTE — Progress Notes (Signed)
Virtual Visit via Video Note  I connected with Diamond [name] on 03/24/19 at 12:20 PM EDT by a video enabled telemedicine application and verified that I am speaking with the correct person using two identifiers. This visit type was conducted due to national recommendations for restrictions regarding the COVID-19 Pandemic (e.g. social distancing).  This format is felt to be most appropriate for this patient at this time.   I discussed the limitations of evaluation and management by telemedicine and the availability of in person appointments. The patient expressed understanding and agreed to proceed.  Only the patient and myself were on today's video visit. The patient was at home and I was in my office at the time of today's visit.   History of Present Illness:  Patient is a 33 yr old female who presents today for ED follow up of right breast abscess. ED note is reviewed. She was referred to the Breast center for I and D and was started on clindamycin.  Pt states that she never went to the breast center because the abscess began to drain spontaneously. She was only given a 3 day supply of abx which she completed. She notes that the area is no longer tender and continues to have a small amount of drainage.   She remains uninsured but continues to look for a policy on the open market.    Past Medical History:  Diagnosis Date  . Anemia   . History of chicken pox   . Multiple thyroid nodules 04/2013   3 nodules per pt  . Pneumonia    hx of as child     Social History   Socioeconomic History  . Marital status: Single    Spouse name: Not on file  . Number of children: Not on file  . Years of education: Not on file  . Highest education level: Not on file  Occupational History  . Not on file  Social Needs  . Financial resource strain: Not on file  . Food insecurity:    Worry: Not on file    Inability: Not on file  . Transportation needs:    Medical: Not on file    Non-medical:  Not on file  Tobacco Use  . Smoking status: Never Smoker  . Smokeless tobacco: Never Used  Substance and Sexual Activity  . Alcohol use: Yes    Comment: occasional  . Drug use: No  . Sexual activity: Yes    Partners: Male    Birth control/protection: None  Lifestyle  . Physical activity:    Days per week: Not on file    Minutes per session: Not on file  . Stress: Not on file  Relationships  . Social connections:    Talks on phone: Not on file    Gets together: Not on file    Attends religious service: Not on file    Active member of club or organization: Not on file    Attends meetings of clubs or organizations: Not on file    Relationship status: Not on file  . Intimate partner violence:    Fear of current or ex partner: Not on file    Emotionally abused: Not on file    Physically abused: Not on file    Forced sexual activity: Not on file  Other Topics Concern  . 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 APPENDECTOMY     "teenager"  . FRACTURE SURGERY Right    "compound fracture of leg per pt"  . HARDWARE REMOVAL     a month after fracture surgery  . THYROIDECTOMY N/A 10/08/2013   Procedure: TOTAL THYROIDECTOMY;  Surgeon: Velora Heckler, MD;  Location: [facility];  Service: General;  Laterality: N/A;  . TONSILLECTOMY AND ADENOIDECTOMY  2012    Family History  Problem Relation Age of Onset  . Thyroid disease Mother        " had thyroid removed due to nodules per pt"  . Hyperlipidemia Father   . Hypertension Father   . Diabetes Father   . Cancer Paternal Grandmother        lung  .  Hyperlipidemia Paternal Grandmother   . Hypertension Paternal Grandmother   . Diabetes Paternal Grandmother     Allergies  Allergen Reactions  . Penicillins Rash    Has patient had a PCN reaction causing immediate rash, facial/tongue/throat swelling, SOB or lightheadedness with hypotension: Y Has patient had a PCN reaction causing severe rash involving mucus membranes or skin necrosis: Y Has patient had a PCN reaction that required hospitalization: N Has patient had a PCN reaction occurring within the last 10 years: Y If all of the above answers are "NO", then may proceed with Cephalosporin use.     Current Outpatient Medications on File Prior to Visit  Medication Sig Dispense Refill  . cetirizine (ZYRTEC ALLERGY) 10 MG tablet Take 1 tablet (10 mg total) by mouth daily. 14 tablet 0  . ferrous sulfate 325 (65 FE) MG tablet Take 325 mg by mouth every other day.     . fluticasone (FLONASE) 50 MCG/ACT nasal spray Place 1 spray into both nostrils daily. 16 g 0  . hydrocortisone 2.5 % lotion Apply topically 2 (two) times daily. 59 mL 0  . levothyroxine (SYNTHROID, LEVOTHROID) 175 MCG tablet Take 1 tablet (175 mcg total) by mouth daily before breakfast. 45 tablet 3  . meloxicam (MOBIC) 15 MG tablet Take 1 tablet (15 mg total) by mouth daily. 30 tablet 1  . Pseudoephedrine-APAP-DM (DAYQUIL PO) Take by mouth.     No current facility-administered medications on file prior to visit.     LMP 03/16/2019    Observations/Objective:   Gen: Awake, alert, no acute distress Resp: Breathing is even and non-labored Psych: calm/pleasant demeanor Neuro: Alert and Oriented x 3, + facial symmetry, speech is clear. Breast: Right breast is noted to have small open wound (approx 1 cm wide) with shallow pink base and some surrounding hyperpigmentation. No significant erythema. Serous drainage noted on wound dressing. (denies associated induration or tenderness)  Assessment and Plan:  Breast abscess-  improving. Will give her an additional 4 days of abx and advised the patient to call if wound and skin does not continue to improve of if it has no completely returned to normal in then next week or two. Pt verbalizes understanding.   Cardiomegaly- still can't afford echo but she is reminded to let me know when she obtains insurance so we can  order.    Follow Up Instructions:    I discussed the assessment and treatment plan with the patient. The patient was provided an opportunity to ask questions and all were answered. The patient agreed with the plan and demonstrated an understanding of the instructions.   The patient was advised to call back or seek an in-person evaluation if the symptoms worsen or if the condition fails to improve as anticipated.    Lemont FillersMelissa S O'Sullivan, NP

## 2019-04-01 ENCOUNTER — Other Ambulatory Visit: Payer: Self-pay

## 2019-04-01 ENCOUNTER — Other Ambulatory Visit (INDEPENDENT_AMBULATORY_CARE_PROVIDER_SITE_OTHER): Payer: Self-pay

## 2019-04-01 DIAGNOSIS — E890 Postprocedural hypothyroidism: Secondary | ICD-10-CM

## 2019-04-01 LAB — T4, FREE: Free T4: 1.19 ng/dL (ref 0.60–1.60)

## 2019-04-01 LAB — TSH: TSH: 4.32 u[IU]/mL (ref 0.35–4.50)

## 2019-04-12 ENCOUNTER — Ambulatory Visit (INDEPENDENT_AMBULATORY_CARE_PROVIDER_SITE_OTHER): Payer: Self-pay | Admitting: Podiatry

## 2019-04-12 ENCOUNTER — Other Ambulatory Visit: Payer: Self-pay

## 2019-04-12 ENCOUNTER — Encounter: Payer: Self-pay | Admitting: Podiatry

## 2019-04-12 DIAGNOSIS — M659 Synovitis and tenosynovitis, unspecified: Secondary | ICD-10-CM

## 2019-04-12 DIAGNOSIS — M76822 Posterior tibial tendinitis, left leg: Secondary | ICD-10-CM

## 2019-04-14 ENCOUNTER — Encounter: Payer: Self-pay | Admitting: Internal Medicine

## 2019-04-15 NOTE — Progress Notes (Signed)
   HPI: 33 year old morbidly obese female presents the office today for follow-up evaluation regarding left foot and ankle pain. She states she is doing well but is still experiencing pain. She has been taking Meloxicam and using the ankle brace as directed. Standing for long periods of time increases her pain. Patient is here for further evaluation and treatment.   Past Medical History:  Diagnosis Date  . Anemia   . History of chicken pox   . Multiple thyroid nodules 04/2013   3 nodules per pt  . Pneumonia    hx of as child     Physical Exam: General: The patient is alert and oriented x3 in no acute distress.  Dermatology: Skin is warm, dry and supple bilateral lower extremities. Negative for open lesions or macerations.  Vascular: Palpable pedal pulses bilaterally. No edema or erythema noted. Capillary refill within normal limits.  Neurological: Epicritic and protective threshold grossly intact bilaterally.   Musculoskeletal Exam: Range of motion within normal limits to all pedal and ankle joints bilateral. Muscle strength 5/5 in all groups bilateral.  There continues to be some pain on palpation to the medial and anterior aspects of the left ankle.  Also pain on palpation along the posterior tibial tendon as it enters the medial longitudinal arch of the foot.  Assessment: 1. Synovitis left ankle-medial and anterior aspects 2. Posterior tibial tendinitis left   Plan of Care:  1. Patient evaluated.  2. Appointment with Liliane Channel, Pedorthist, for custom molded orthotics.  3. Continue taking Meloxicam and using ankle brace daily.  4. Continue limited restrictions at work for 3 months.  5. Return to clinic in 3 months.   Works on concrete floors standing.      Edrick Kins, DPM [facility]  Dr. Edrick Kins, DPM    [address], [place] [address]                Office [phone]  Fax [phone]

## 2019-04-23 ENCOUNTER — Encounter: Payer: Self-pay | Admitting: Family

## 2019-04-23 NOTE — Telephone Encounter (Signed)
Please contact pt to schedule OV to discuss anxiety. OK to do a virtual visit.

## 2019-04-30 ENCOUNTER — Other Ambulatory Visit: Payer: Self-pay | Admitting: Internal Medicine

## 2019-05-03 ENCOUNTER — Ambulatory Visit: Payer: Self-pay | Admitting: Orthotics

## 2019-05-03 ENCOUNTER — Other Ambulatory Visit: Payer: Self-pay

## 2019-05-03 DIAGNOSIS — M76822 Posterior tibial tendinitis, left leg: Secondary | ICD-10-CM

## 2019-05-03 NOTE — Progress Notes (Signed)
Patient came in today to pick up custom made foot orthotics.  The goals were accomplished and the patient reported no dissatisfaction with said orthotics.  Patient was advised of breakin period and how to report any issues. 

## 2019-06-09 ENCOUNTER — Emergency Department (HOSPITAL_COMMUNITY): Payer: Self-pay

## 2019-06-09 ENCOUNTER — Emergency Department (HOSPITAL_COMMUNITY)
Admission: EM | Admit: 2019-06-09 | Discharge: 2019-06-09 | Disposition: A | Discharge status: Home / Self Care | Payer: Self-pay | Attending: Emergency Medicine | Admitting: Emergency Medicine

## 2019-06-09 ENCOUNTER — Encounter (HOSPITAL_COMMUNITY): Payer: Self-pay | Admitting: Obstetrics and Gynecology

## 2019-06-09 ENCOUNTER — Other Ambulatory Visit: Payer: Self-pay

## 2019-06-09 DIAGNOSIS — D509 Iron deficiency anemia, unspecified: Secondary | ICD-10-CM | POA: Insufficient documentation

## 2019-06-09 DIAGNOSIS — R509 Fever, unspecified: Secondary | ICD-10-CM | POA: Insufficient documentation

## 2019-06-09 DIAGNOSIS — E890 Postprocedural hypothyroidism: Secondary | ICD-10-CM | POA: Insufficient documentation

## 2019-06-09 LAB — CBC WITH DIFFERENTIAL/PLATELET
Abs Immature Granulocytes: 0.01 10*3/uL (ref 0.00–0.07)
Basophils Absolute: 0 10*3/uL (ref 0.0–0.1)
Basophils Relative: 0 %
Eosinophils Absolute: 0 10*3/uL (ref 0.0–0.5)
Eosinophils Relative: 0 %
HCT: 34.4 % — ABNORMAL LOW (ref 36.0–46.0)
Hemoglobin: 10.2 g/dL — ABNORMAL LOW (ref 12.0–15.0)
Immature Granulocytes: 0 %
Lymphocytes Relative: 47 %
Lymphs Abs: 1.7 10*3/uL (ref 0.7–4.0)
MCH: 24.5 pg — ABNORMAL LOW (ref 26.0–34.0)
MCHC: 29.7 g/dL — ABNORMAL LOW (ref 30.0–36.0)
MCV: 82.7 fL (ref 80.0–100.0)
Monocytes Absolute: 0.3 10*3/uL (ref 0.1–1.0)
Monocytes Relative: 9 %
Neutro Abs: 1.6 10*3/uL — ABNORMAL LOW (ref 1.7–7.7)
Neutrophils Relative %: 44 %
Platelets: 244 10*3/uL (ref 150–400)
RBC: 4.16 MIL/uL (ref 3.87–5.11)
RDW: 16.8 % — ABNORMAL HIGH (ref 11.5–15.5)
WBC: 3.6 10*3/uL — ABNORMAL LOW (ref 4.0–10.5)
nRBC: 0 % (ref 0.0–0.2)

## 2019-06-09 LAB — COMPREHENSIVE METABOLIC PANEL
ALT: 21 U/L (ref 0–44)
AST: 22 U/L (ref 15–41)
Albumin: 3.9 g/dL (ref 3.5–5.0)
Alkaline Phosphatase: 54 U/L (ref 38–126)
Anion gap: 8 (ref 5–15)
BUN: 10 mg/dL (ref 6–20)
CO2: 24 mmol/L (ref 22–32)
Calcium: 8.8 mg/dL — ABNORMAL LOW (ref 8.9–10.3)
Chloride: 108 mmol/L (ref 98–111)
Creatinine, Ser: 0.51 mg/dL (ref 0.44–1.00)
GFR calc Af Amer: >60 mL/min (ref >60)
GFR calc non Af Amer: >60 mL/min (ref >60)
Glucose, Bld: 115 mg/dL — ABNORMAL HIGH (ref 70–99)
Potassium: 3.7 mmol/L (ref 3.5–5.1)
Sodium: 140 mmol/L (ref 135–145)
Total Bilirubin: 0.3 mg/dL (ref 0.3–1.2)
Total Protein: 7.5 g/dL (ref 6.5–8.1)

## 2019-06-09 LAB — URINALYSIS, ROUTINE W REFLEX MICROSCOPIC
Bilirubin Urine: NEGATIVE
Glucose, UA: NEGATIVE mg/dL
Hgb urine dipstick: NEGATIVE
Ketones, ur: NEGATIVE mg/dL
Leukocytes,Ua: NEGATIVE
Nitrite: NEGATIVE
Protein, ur: 30 mg/dL — AB
Specific Gravity, Urine: 1.026 (ref 1.005–1.030)
pH: 5 (ref 5.0–8.0)

## 2019-06-09 LAB — I-STAT BETA HCG BLOOD, ED (MC, WL, AP ONLY): I-stat hCG, quantitative: <5 m[IU]/mL (ref <5)

## 2019-06-09 NOTE — Discharge Instructions (Addendum)
No source for your fever found on your work-up today.  Recommend follow-up with your primary care provider.  Also recommend taking a daily iron supplement for likely iron deficiency anemia.  This may be leading to your fatigue.

## 2019-06-09 NOTE — ED Notes (Signed)
Patient states she is still unable to give urine sample at this time she needs some water

## 2019-06-09 NOTE — ED Triage Notes (Signed)
Patient reports she has been having a fever over 100.0 the past few days. Pt reports chills and fatigue as well.  Pt states she has been taking OTC robitussin.

## 2019-06-09 NOTE — ED Provider Notes (Signed)
[facility] DEPT Provider Note   CSN: [id number] Arrival date & time: 06/09/19  0919    History   Chief Complaint Chief Complaint  Patient presents with  . Fatigue  . Fever    HPI Diamond [name] is a 33 y.o. female.     33 year old female presents with complaint of fevers, chills, fatigue.  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  Patient reports her oral temperature last night was 106.  Patient took Robitussin containing Tylenol and her symptoms improved.  Reports intermittent fatigue throughout the day.  When questioned further on the temperature, patient states her temperature was actually 102.6 last night.  She attributes this to moving a lot at work.  No known sick contacts, no recent travel, denies loss of smell or taste, body aches, cough, congestion, sore throat or other sick symptoms.  Patient last took Robitussin at 7 AM.  Diamond [name] was evaluated in Emergency Department on 06/09/2019 for the symptoms described in the history of present illness. She was evaluated in the context of the global COVID-19 pandemic, which necessitated consideration that the patient might be at risk for infection with the SARS-CoV-2 virus that causes COVID-19. Institutional protocols and algorithms that pertain to the evaluation of patients at risk for COVID-19 are in a state of rapid change based on information released by regulatory bodies including the CDC and federal and state organizations. These policies and algorithms were followed during the patient's care in the ED.      Past Medical History:  Diagnosis Date  . Anemia   . History of chicken pox   . Multiple thyroid nodules 04/2013   3 nodules per pt  . Pneumonia    hx of as child    Patient  Active Problem List   Diagnosis Date Noted  . S/P thyroidectomy 12/08/2017  . Iron deficiency anemia 09/19/2016  . 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 Routine general medical examination at a health care facility 08/23/2013  . Cervical lesion 08/23/2013    Past Surgical History:  Procedure Laterality Date  . APPENDECTOMY     "teenager"  . FRACTURE SURGERY Right    "compound fracture of leg per pt"  . HARDWARE REMOVAL     a month after fracture surgery  . THYROIDECTOMY N/A 10/08/2013   Procedure: TOTAL THYROIDECTOMY;  Surgeon: Earnstine Regal, MD;  Location: [facility];  Service: General;  Laterality: N/A;  . TONSILLECTOMY AND ADENOIDECTOMY  2012     OB History   No obstetric history on file.      Home Medications    Prior to Admission medications   Medication Sig Start Date End Date Taking? Authorizing Provider  acetaminophen (TYLENOL) 500 MG tablet Take 1,000 mg by mouth every 6 (six) hours as needed for fever.   Yes [provider]  guaifenesin (ROBITUSSIN) 100 MG/5ML syrup Take 400 mg by mouth daily as needed for cough.   Yes [provider]  levothyroxine (SYNTHROID) 175 MCG tablet TAKE 1 TABLET (175 MCG TOTAL) BY MOUTH DAILY BEFORE BREAKFAST. 04/30/19  Yes Philemon Kingdom, MD  meloxicam (MOBIC) 15 MG tablet Take 1 tablet (15 mg total) by mouth daily. 03/01/19  Yes Edrick Kins, DPM  cetirizine (ZYRTEC ALLERGY)  10 MG tablet Take 1 tablet (10 mg total) by mouth daily. Patient not taking: Reported on 06/09/2019 02/03/19   Petrucelli, Pleas KochSamantha R, PA-C  clindamycin (CLEOCIN) 150 MG capsule Take 3 capsules (450 mg total) by mouth 3 (three) times daily. Patient not taking: Reported on 06/09/2019 03/24/19   Sandford Craze'Sullivan, Melissa, NP  fluticasone [facility](FLONASE) 50 MCG/ACT nasal spray Place 1 spray into both nostrils daily. Patient not taking: Reported on 06/09/2019 02/03/19   Petrucelli, Pleas KochSamantha R, PA-C  hydrocortisone 2.5 % lotion Apply topically 2 (two) times daily.  Patient not taking: Reported on 06/09/2019 01/17/19   Emi HolesLaw, Alexandra M, PA-C    Family History Family History  Problem Relation Age of Onset  . Thyroid disease Mother        " had thyroid removed due to nodules per pt"  . Hyperlipidemia Father   . Hypertension Father   . Diabetes Father   . Cancer Paternal Grandmother        lung  . Hyperlipidemia Paternal Grandmother   . Hypertension Paternal Grandmother   . Diabetes Paternal Grandmother     Social History Social History   Tobacco Use  . Smoking status: Never Smoker  . Smokeless tobacco: Never Used  Substance Use Topics  . Alcohol use: Yes    Comment: occasional  . Drug use: No     Allergies   Penicillins   Review of Systems Review of Systems  Constitutional: Positive for chills, fatigue and fever.  HENT: Negative for congestion, rhinorrhea and sore throat.   Respiratory: Negative for cough and shortness of breath.   Gastrointestinal: Negative for abdominal pain, constipation, diarrhea, nausea and vomiting.  Genitourinary: Negative for dysuria, frequency and urgency.  Musculoskeletal: Negative for arthralgias and myalgias.  Skin: Negative for color change, rash and wound.  Allergic/Immunologic: Negative for immunocompromised state.  Neurological: Negative for dizziness, weakness and headaches.  Psychiatric/Behavioral: Negative for confusion.  All other systems reviewed and are negative.    Physical Exam Updated Vital Signs BP 140/81 (BP Location: Left Arm)   Pulse 76   Temp 98.4 F (36.9 C) (Oral)   Resp 16   LMP 05/26/2019 (Approximate)   SpO2 96%   Physical Exam Vitals signs and nursing note reviewed.  Constitutional:      Appearance: Normal appearance. She is obese.  HENT:     Head: Normocephalic and atraumatic.     Right Ear: Tympanic membrane and ear canal normal.     Left Ear: Tympanic membrane and ear canal normal.  Cardiovascular:     Rate and Rhythm: Normal rate and regular rhythm.      Pulses: Normal pulses.     Heart sounds: Normal heart sounds.  Pulmonary:     Effort: Pulmonary effort is normal.     Breath sounds: Normal breath sounds.  Musculoskeletal:        General: No swelling or tenderness.  Skin:    General: Skin is warm and dry.  Neurological:     Mental Status: She is alert and oriented to person, place, and time.  Psychiatric:        Behavior: Behavior normal.      ED Treatments / Results  Labs (all labs ordered are listed, but only abnormal results are displayed) Labs Reviewed  COMPREHENSIVE METABOLIC PANEL - Abnormal; Notable for the following components:      Result Value   Glucose, Bld 115 (*)    Calcium 8.8 (*)    All other components within normal limits  CBC WITH DIFFERENTIAL/PLATELET - Abnormal; Notable for the following components:   WBC 3.6 (*)    Hemoglobin 10.2 (*)    HCT 34.4 (*)    MCH 24.5 (*)    MCHC 29.7 (*)    RDW 16.8 (*)    Neutro Abs 1.6 (*)    All other components within normal limits  URINALYSIS, ROUTINE W REFLEX MICROSCOPIC  I-STAT BETA HCG BLOOD, ED ([facility], [facility], [facility] ONLY)    EKG None  Radiology Dg Chest 2 View  Result Date: 06/09/2019 CLINICAL DATA:  Fever EXAM: CHEST - 2 VIEW COMPARISON:  02/03/2019 FINDINGS: Cardiomegaly. Both lungs are clear. The visualized skeletal structures are unremarkable. IMPRESSION: Cardiomegaly without acute abnormality of the lungs. Electronically Signed   By: lyn PrimesAlex  Bibbey M.D.   On: 06/09/2019 10:40    Procedures Procedures (including critical care time)  Medications Ordered in ED Medications - No data to display   Initial Impression / Assessment and Plan / ED Course  I have reviewed the triage vital signs and the nursing notes.  Pertinent labs & imaging results that were available during my care of the patient were reviewed by me and considered in my medical decision making (see chart for details).  Clinical Course as of Jun 08 1422  Wed Jun 09, 2019  [id number] 33 year old female  presents with complaint of fatigue and fever for the past few days.  Patient states that her temperature at home last night was 106, when discussing this further, she states her fever may have been 102.6.  She took Robitussin and her fever resolved.  Patient arrives emergency room today afebrile, afebrile on recheck as well.  No other associated symptoms, no symptoms consistent with COVID.  Patient's chest x-ray is unremarkable, she does not have any urinary symptoms, CBC with baseline anemia hemoglobin 10.2, recommend iron supplement.  Patient's white count is 3.6 today.  CMP is unremarkable, hCG is negative.  Recommend patient follow-up with her PCP.   [LM]    Clinical Course User Index [LM] Jeannie FendMurphy,  A, PA-C      Final Clinical Impressions(s) / ED Diagnoses   Final diagnoses:  Fever, unspecified  Iron deficiency anemia, unspecified iron deficiency anemia type    ED Discharge Orders    None       Jeannie FendMurphy,  A, PA-C 06/09/19 1423    Vanetta MuldersZackowski, Scott, MD 06/11/19 1601

## 2019-06-14 ENCOUNTER — Ambulatory Visit (INDEPENDENT_AMBULATORY_CARE_PROVIDER_SITE_OTHER): Payer: Self-pay | Admitting: Family

## 2019-06-14 ENCOUNTER — Other Ambulatory Visit: Payer: Self-pay

## 2019-06-14 VITALS — Temp 97.4°F | Wt 325.0 lb

## 2019-06-14 DIAGNOSIS — Z20822 Contact with and (suspected) exposure to covid-19: Secondary | ICD-10-CM

## 2019-06-14 DIAGNOSIS — R809 Proteinuria, unspecified: Secondary | ICD-10-CM

## 2019-06-14 DIAGNOSIS — Z20828 Contact with and (suspected) exposure to other viral communicable diseases: Secondary | ICD-10-CM

## 2019-06-14 DIAGNOSIS — R5383 Other fatigue: Secondary | ICD-10-CM

## 2019-06-14 DIAGNOSIS — I517 Cardiomegaly: Secondary | ICD-10-CM

## 2019-06-14 NOTE — Progress Notes (Signed)
Virtual Visit via Video Note  I connected with Diamond [name] on 06/14/19 at  9:20 AM EDT by a video enabled telemedicine application and verified that I am speaking with the correct person using two identifiers.  Location: Patient: home Provider: home   I discussed the limitations of evaluation and management by telemedicine and the availability of in person appointments. The patient expressed understanding and agreed to proceed.  History of Present Illness:  Reports that she had low grade fever every day last week.  + fatigue, chills.  Reports that she went to the ED. ED record is reviewed.  Note was made of anemia, proteinuria. CXR noted cardiomegaly but no acute abnormality.  Reports ongoing + fatigue.  Reports some back pain.  Denies cough. Denies change in taste/smell. She works for the [employer].  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   Past Medical History:  Diagnosis Date  . Anemia   . History of chicken pox   . Multiple thyroid nodules 04/2013   3 nodules per pt  . Pneumonia    hx of as child     Social History   Socioeconomic History  . Marital status: Single    Spouse name: Not on file  . Number of children: Not on file  . Years of education: Not on file  . Highest education level: Not on file  Occupational History  . Not on file  Social Needs  . Financial resource strain: Not on file  . Food insecurity    Worry: Not on file    Inability: Not on file  . Transportation needs    Medical: Not on file    Non-medical: Not on file  Tobacco Use  . Smoking status: Never Smoker  . Smokeless tobacco: Never Used  Substance and Sexual Activity  . Alcohol use: Yes    Comment: occasional  . Drug use: No  . Sexual activity: Yes    Partners: Male    Birth control/protection: None  Lifestyle  . Physical activity    Days per week: Not on file    Minutes per session: Not on file  . Stress: Not on file  Relationships  .  Social Musicianconnections    Talks on phone: Not on file    Gets together: Not on file    Attends religious service: Not on file    Active member of club or organization: Not on file    Attends meetings of clubs or organizations: Not on file    Relationship status: Not on file  . Intimate partner violence    Fear of current or ex partner: Not on file    Emotionally abused: Not on file    Physically abused: Not on file    Forced sexual activity: Not on file  Other Topics Concern  . 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 APPENDECTOMY     "teenager"  . FRACTURE  SURGERY Right    "compound fracture of leg per pt"  . HARDWARE REMOVAL     a month after fracture surgery  . THYROIDECTOMY N/A 10/08/2013   Procedure: TOTAL THYROIDECTOMY;  Surgeon: Earnstine Regal, MD;  Location: [facility];  Service: General;  Laterality: N/A;  . TONSILLECTOMY AND ADENOIDECTOMY  2012    Family History  Problem Relation Age of Onset  . Thyroid disease Mother        " had thyroid removed due to nodules per pt"  . Hyperlipidemia Father   . Hypertension Father   . Diabetes Father   . Cancer Paternal Grandmother        lung  . Hyperlipidemia Paternal Grandmother   . Hypertension Paternal Grandmother   . Diabetes Paternal Grandmother     Allergies  Allergen Reactions  . Penicillins Rash    Has patient had a PCN reaction causing immediate rash, facial/tongue/throat swelling, SOB or lightheadedness with hypotension: Y Has patient had a PCN reaction causing severe rash involving mucus membranes or skin necrosis: Y Has patient had a  PCN reaction that required hospitalization: N Has patient had a PCN reaction occurring within the last 10 years: Y If all of the above answers are "NO", then may proceed with Cephalosporin use.     Current Outpatient Medications on File Prior to Visit  Medication Sig Dispense Refill  . acetaminophen (TYLENOL) 500 MG tablet Take 1,000 mg by mouth every 6 (six) hours as needed for fever.    Marland Kitchen guaifenesin (ROBITUSSIN) 100 MG/5ML syrup Take 400 mg by mouth daily as needed for cough.    . levothyroxine (SYNTHROID) 175 MCG tablet TAKE 1 TABLET (175 MCG TOTAL) BY MOUTH DAILY BEFORE BREAKFAST. 45 tablet 3  . meloxicam (MOBIC) 15 MG tablet Take 1 tablet (15 mg total) by mouth daily. 30 tablet 1   No current facility-administered medications on file prior to visit.     Temp (!) 97.4 F (36.3 C) (Oral)   Wt (!) 325 lb (147.4 kg)   LMP 05/26/2019 (Approximate)   BMI 49.42 kg/m      Observations/Objective:   Gen: Awake, alert, no acute distress Resp: Breathing is even and non-labored Psych: calm/pleasant demeanor Neuro: Alert and Oriented x 3, + facial symmetry, speech is clear.   Assessment and Plan:  Fatigue- recent symptoms concerning for COVID-19. I have advised pt to be tested, self quarantine at home and to remain out of work until further instructions. She is advised to return to the ED if she develops severe/worsening symptoms or if she develops shortness of breath.   Cardiomegaly- she has had some borderline BP readings. Plan to repeat bp in office in 1 month. Will also plan to order 2D echo at that time. Treat bp if elevated.  Proteinuria- plan to repeat urinalysis at her 1 month follow up.  Follow Up Instructions:    I discussed the assessment and treatment plan with the patient. The patient was provided an opportunity to ask questions and all were answered. The patient agreed with the plan and demonstrated an understanding of the instructions.   The patient was advised to  call back or seek an in-person evaluation if the symptoms worsen or if the condition fails to improve as anticipated.  Nance Pear, NP

## 2019-06-15 LAB — NOVEL CORONAVIRUS, NAA: SARS-CoV-2, NAA: NOT DETECTED

## 2019-06-22 ENCOUNTER — Ambulatory Visit (INDEPENDENT_AMBULATORY_CARE_PROVIDER_SITE_OTHER): Payer: Self-pay | Admitting: Family

## 2019-06-22 ENCOUNTER — Other Ambulatory Visit: Payer: Self-pay

## 2019-06-22 DIAGNOSIS — R739 Hyperglycemia, unspecified: Secondary | ICD-10-CM

## 2019-06-22 DIAGNOSIS — R030 Elevated blood-pressure reading, without diagnosis of hypertension: Secondary | ICD-10-CM

## 2019-06-22 DIAGNOSIS — I517 Cardiomegaly: Secondary | ICD-10-CM

## 2019-06-22 DIAGNOSIS — D649 Anemia, unspecified: Secondary | ICD-10-CM

## 2019-06-22 NOTE — Progress Notes (Signed)
Virtual Visit via Video Note  I connected with Diamond [name] on 06/22/19 at  8:20 AM EDT by a video enabled telemedicine application and verified that I am speaking with the correct person using two identifiers.  Location: Patient: home Provider: office   I discussed the limitations of evaluation and management by telemedicine and the availability of in person appointments. The patient expressed understanding and agreed to proceed.  History of Present Illness:  Patient is a 33 yr old female who presents today for follow up. We last saw her on 8/10. At that time we were concerned about the possibility of COVID-19. Pt was sent for testing. Testing was negative. Today she reports that her fatigue is resolved.    Had dizziness yesterday AM but none so far today.  Notes that she has not been sleeping well. Getting up every 2 hrs.  She denies cough/sob.    She does not have a blood pressure machine.    BP Readings from Last 3 Encounters:  06/09/19 140/80  03/18/19 (!) 151/82  02/03/19 130/79    Past Medical History:  Diagnosis Date  . Anemia   . History of chicken pox   . Multiple thyroid nodules 04/2013   3 nodules per pt  . Pneumonia    hx of as child     Social History   Socioeconomic History  . Marital status: Single    Spouse name: Not on file  . Number of children: Not on file  . Years of education: Not on file  . Highest education level: Not on file  Occupational History  . Not on file  Social Needs  . Financial resource strain: Not on file  . Food insecurity    Worry: Not on file    Inability: Not on file  . Transportation needs    Medical: Not on file    Non-medical: Not on file  Tobacco Use  . Smoking status: Never Smoker  . Smokeless tobacco: Never Used  Substance and Sexual Activity  . Alcohol use: Yes    Comment: occasional  . Drug use: No  . Sexual activity: Yes    Partners: Male    Birth control/protection: None  Lifestyle  . Physical  activity    Days per week: Not on file    Minutes per session: Not on file  . Stress: Not on file  Relationships  . Social Herbalist on phone: Not on file    Gets together: Not on file    Attends religious service: Not on file    Active member of club or organization: Not on file    Attends meetings of clubs or organizations: Not on file    Relationship status: Not on file  . Intimate partner violence    Fear of current or ex partner: Not on file    Emotionally abused: Not on file    Physically abused: Not on file    Forced sexual activity: Not on file  Other Topics Concern  . 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 APPENDECTOMY     "teenager"  . FRACTURE SURGERY Right    "compound fracture of leg per pt"  . HARDWARE REMOVAL     a month after fracture surgery  . THYROIDECTOMY N/A 10/08/2013   Procedure: TOTAL THYROIDECTOMY;  Surgeon: Velora Hecklerodd M Gerkin, MD;  Location: [facility];  Service: General;  Laterality: N/A;  . TONSILLECTOMY AND ADENOIDECTOMY  2012    Family History  Problem Relation Age of Onset  . Thyroid disease Mother        " had thyroid removed due to nodules per pt"  . Hyperlipidemia Father   . Hypertension Father   . Diabetes Father   . Cancer Paternal Grandmother        lung  . Hyperlipidemia Paternal Grandmother   . Hypertension Paternal Grandmother   . Diabetes Paternal Grandmother     Allergies  Allergen Reactions  . Penicillins Rash    Has patient had a PCN reaction causing immediate rash, facial/tongue/throat swelling, SOB or lightheadedness with  hypotension: Y Has patient had a PCN reaction causing severe rash involving mucus membranes or skin necrosis: Y Has patient had a PCN reaction that required hospitalization: N Has patient had a PCN reaction occurring within the last 10 years: Y If all of the above answers are "NO", then may proceed with Cephalosporin use.     Current Outpatient Medications on File Prior to Visit  Medication Sig Dispense Refill  . acetaminophen (TYLENOL) 500 MG tablet Take 1,000 mg by mouth every 6 (six) hours as needed for fever.    Marland Kitchen. guaifenesin (ROBITUSSIN) 100 MG/5ML syrup Take 400 mg by mouth daily as needed for cough.    . levothyroxine (SYNTHROID) 175 MCG tablet TAKE 1 TABLET (175 MCG TOTAL) BY MOUTH DAILY BEFORE BREAKFAST. 45 tablet 3  . meloxicam (MOBIC) 15 MG tablet Take 1 tablet (15 mg total) by mouth daily. 30 tablet 1   No current facility-administered medications on file prior to visit.     LMP 05/26/2019 (Approximate)      Observations/Objective:   Gen: Awake, alert, no acute distress Resp: Breathing is even and non-labored Psych: calm/pleasant demeanor Neuro: Alert and Oriented x 3, + facial symmetry, speech is clear.   Assessment and Plan: Fatigue-this is resolved.  Perhaps she had a viral illness other than COVID.  We will continue to monitor.  Cardiomegaly-this was noted incidentally on chest x-ray.  Will obtain a 2D echo.  New  Elevated blood pressure reading-I would like to see her follow-up blood pressure.  Will arrange a nurse visit for blood pressure check and follow-up lab work.  We discussed importance of weight loss and low-sodium diet.  Proteinuria- plan to obtain follow-up urinalysis.  This was noted during her ER visit.   Follow Up Instructions:    I discussed the assessment and treatment plan with the patient. The patient was provided an opportunity to ask questions and all were answered. The patient agreed with the plan and demonstrated an understanding of  the instructions.   The patient was advised to call back or seek an in-person evaluation if the symptoms worsen or if the condition fails to improve as anticipated.  Lemont FillersMelissa S O'Sullivan, NP

## 2019-06-25 ENCOUNTER — Ambulatory Visit (HOSPITAL_BASED_OUTPATIENT_CLINIC_OR_DEPARTMENT_OTHER): Payer: Self-pay

## 2019-07-14 ENCOUNTER — Other Ambulatory Visit: Payer: Self-pay

## 2019-07-14 ENCOUNTER — Encounter: Payer: Self-pay | Admitting: Podiatry

## 2019-07-14 ENCOUNTER — Ambulatory Visit (INDEPENDENT_AMBULATORY_CARE_PROVIDER_SITE_OTHER): Payer: Self-pay | Admitting: Podiatry

## 2019-07-14 DIAGNOSIS — M76822 Posterior tibial tendinitis, left leg: Secondary | ICD-10-CM

## 2019-07-14 DIAGNOSIS — M659 Synovitis and tenosynovitis, unspecified: Secondary | ICD-10-CM

## 2019-07-14 MED ORDER — MELOXICAM 15 MG PO TABS: 15.0000 mg | ORAL_TABLET | Freq: Every day | ORAL | 1 refills | Status: DC

## 2019-07-15 ENCOUNTER — Telehealth: Payer: Self-pay | Admitting: Family

## 2019-07-15 NOTE — Telephone Encounter (Signed)
No, that is great, thanks!

## 2019-07-15 NOTE — Telephone Encounter (Signed)
Please contact pt to arrange a lab visit to complete lab work.

## 2019-07-16 ENCOUNTER — Other Ambulatory Visit: Payer: Self-pay

## 2019-07-16 ENCOUNTER — Telehealth: Payer: Self-pay | Admitting: Family

## 2019-07-16 ENCOUNTER — Other Ambulatory Visit (INDEPENDENT_AMBULATORY_CARE_PROVIDER_SITE_OTHER): Payer: Self-pay

## 2019-07-16 DIAGNOSIS — D649 Anemia, unspecified: Secondary | ICD-10-CM

## 2019-07-16 DIAGNOSIS — R739 Hyperglycemia, unspecified: Secondary | ICD-10-CM

## 2019-07-16 DIAGNOSIS — R809 Proteinuria, unspecified: Secondary | ICD-10-CM

## 2019-07-16 LAB — HEMOGLOBIN A1C: Hgb A1c MFr Bld: 6.3 % (ref 4.6–6.5)

## 2019-07-16 LAB — URINALYSIS, ROUTINE W REFLEX MICROSCOPIC
Bilirubin Urine: NEGATIVE
Hgb urine dipstick: NEGATIVE
Ketones, ur: NEGATIVE
Leukocytes,Ua: NEGATIVE
Nitrite: NEGATIVE
Specific Gravity, Urine: 1.015 (ref 1.000–1.030)
Total Protein, Urine: NEGATIVE
Urine Glucose: NEGATIVE
Urobilinogen, UA: 0.2 (ref 0.0–1.0)
pH: 6 (ref 5.0–8.0)

## 2019-07-16 LAB — IRON: Iron: 18 ug/dL — ABNORMAL LOW (ref 42–145)

## 2019-07-16 LAB — FERRITIN: Ferritin: 12.5 ng/mL (ref 10.0–291.0)

## 2019-07-16 NOTE — Telephone Encounter (Signed)
Please advise pt that her sugar has risen to the borderline diabetes range. She should work on avoiding concentrated sweets, limiting carbs, and weight loss.   Also, iron level is low. I would like her to add on iron 325mg  twice daily (OTC) and repeat cbc, serum iron and ferritin in 3 months.

## 2019-07-17 NOTE — Progress Notes (Signed)
   HPI: 33 year old morbidly obese female presents the office today for follow-up evaluation regarding left foot and ankle pain. She states she is doing well but is still experiencing pain. She has been taking Meloxicam and using the ankle brace as directed. Standing for long periods of time increases her pain. Patient is here for further evaluation and treatment.   Past Medical History:  Diagnosis Date  . Anemia   . History of chicken pox   . Multiple thyroid nodules 04/2013   3 nodules per pt  . Pneumonia    hx of as child     Physical Exam: General: The patient is alert and oriented x3 in no acute distress.  Dermatology: Skin is warm, dry and supple bilateral lower extremities. Negative for open lesions or macerations.  Vascular: Palpable pedal pulses bilaterally. No edema or erythema noted. Capillary refill within normal limits.  Neurological: Epicritic and protective threshold grossly intact bilaterally.   Musculoskeletal Exam: Range of motion within normal limits to all pedal and ankle joints bilateral. Muscle strength 5/5 in all groups bilateral.  There continues to be some pain on palpation to the medial and anterior aspects of the left ankle.  Also pain on palpation along the posterior tibial tendon as it enters the medial longitudinal arch of the foot.  Assessment: 1. Synovitis left ankle-medial and anterior aspects 2. Posterior tibial tendinitis left   Plan of Care:  1. Patient evaluated.  2. Injection of 0.5 mLs Celestone Soluspan injected into the posterior tibial tendon sheath of the left foot.  3. Injection of 0.5 mLs Celestone Soluspan injected into the left ankle joint.  4. Continue using custom orthotics and ankle brace as needed.  5. Refill prescription for Meloxicam provided to patient.  6. Continue limited restrictions at work for three months.  7. Return to clinic as needed.   Works on concrete floors standing. 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      Edrick Kins,  DPM [facility]  Dr. Edrick Kins, DPM    [address], [place] [address]                Office [phone]  Fax [phone]

## 2019-07-19 NOTE — Telephone Encounter (Signed)
All information and provider's advised given to patient,  she verbalized understanding. Test orders entered as future, she will call to schedule the 3 months lab appt.

## 2019-08-19 ENCOUNTER — Telehealth: Payer: Self-pay | Admitting: Podiatry

## 2019-08-19 NOTE — Telephone Encounter (Signed)
Pt called and needs a letter with additional restrictions. She is needing a new note stating she can work 10 hrs a day but for only 6 days a week. 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 And her foot pain was getting better but now that they are requiring her to work 7 days it is getting worse again.

## 2019-08-26 ENCOUNTER — Encounter: Payer: Self-pay | Admitting: Family Medicine

## 2019-08-26 ENCOUNTER — Other Ambulatory Visit: Payer: Self-pay | Admitting: Family Medicine

## 2019-08-26 ENCOUNTER — Ambulatory Visit (INDEPENDENT_AMBULATORY_CARE_PROVIDER_SITE_OTHER): Payer: 59 | Admitting: Family Medicine

## 2019-08-26 DIAGNOSIS — M722 Plantar fascial fibromatosis: Secondary | ICD-10-CM

## 2019-08-26 MED ORDER — MELOXICAM 15 MG PO TABS: 15.0000 mg | ORAL_TABLET | Freq: Every day | ORAL | 1 refills | Status: DC

## 2019-08-26 NOTE — Progress Notes (Signed)
[facility] [address], [address] [place], [place] [address] [phone]  Date:  08/26/2019   Name:  Diamond [name]   DOB:  October 29, 1986   MRN:  [id number]  PCP:  Sandford Craze, NP    Chief Complaint: No chief complaint on file.   History of Present Illness:  Diamond [name] is a 33 y.o. very pleasant female patient who presents with the following:  Primary patient of Daryel Gerald.  History of iron deficiency anemia, postsurgical hypothyroidism Concern today regarding LEFT foot pain Patient location is home, provider is at office Patient identified with 2 factors, she gives consent for virtual visit today  She is working more hours recently on her feet without a day off She is supposed to be on 10 hour shifts only with one day off a week However she has now gone 3 weeks without a day off due to short staffing at her place of work Her podiatrist is Gala Lewandowsky She has been dx with plantar fasciitis by her foot doctor He made her orthotics, advised her to work fewer hours This morning when she woke up her foot was very sore, and her to put pressure on it when she stood up.  Her foot typically hurts the most in the morning, it feels stiff. She could use a refill of her meloxicam- will send to her pharmacy   No recent injury to her foot She needs a note for work today, plans to return to work tomorrow   Patient Active Problem List   Diagnosis Date Noted  . S/P thyroidectomy 12/08/2017  . Iron deficiency anemia 09/19/2016  . 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 Routine general medical examination at a health care facility 08/23/2013  . Cervical lesion 08/23/2013    Past Medical History:  Diagnosis Date  . Anemia   . History of chicken pox   . Multiple thyroid nodules 04/2013   3 nodules per pt  . Pneumonia    hx of as child    Past Surgical History:  Procedure Laterality Date  .  APPENDECTOMY     "teenager"  . FRACTURE SURGERY Right    "compound fracture of leg per pt"  . HARDWARE REMOVAL     a month after fracture surgery  . THYROIDECTOMY N/A 10/08/2013   Procedure: TOTAL THYROIDECTOMY;  Surgeon: Velora Heckler, MD;  Location: [facility];  Service: General;  Laterality: N/A;  . TONSILLECTOMY AND ADENOIDECTOMY  2012    Social History   Tobacco Use  . Smoking status: Never Smoker  . Smokeless tobacco: Never Used  Substance Use Topics  . Alcohol use: Yes    Comment: occasional  . Drug use: No    Family History  Problem Relation Age of Onset  . Thyroid disease Mother        " had thyroid removed due to nodules per pt"  . Hyperlipidemia Father   . Hypertension Father   . Diabetes Father   . Cancer Paternal Grandmother        lung  . Hyperlipidemia Paternal Grandmother   . Hypertension Paternal Grandmother   . Diabetes Paternal Grandmother     Allergies  Allergen Reactions  . Penicillins Rash    Has patient had a PCN reaction causing immediate rash, facial/tongue/throat swelling, SOB or lightheadedness with hypotension: Y Has patient had a PCN reaction causing severe rash involving mucus membranes or skin necrosis: Y Has patient had a PCN  reaction that required hospitalization: N Has patient had a PCN reaction occurring within the last 10 years: Y If all of the above answers are "NO", then may proceed with Cephalosporin use.     Medication list has been reviewed and updated.  Current Outpatient Medications on File Prior to Visit  Medication Sig Dispense Refill  . acetaminophen (TYLENOL) 500 MG tablet Take 1,000 mg by mouth every 6 (six) hours as needed for fever.    Marland Kitchen guaifenesin (ROBITUSSIN) 100 MG/5ML syrup Take 400 mg by mouth daily as needed for cough.    . levothyroxine (SYNTHROID) 175 MCG tablet TAKE 1 TABLET (175 MCG TOTAL) BY MOUTH DAILY BEFORE BREAKFAST. 45 tablet 3  . meloxicam (MOBIC) 15 MG tablet Take 1 tablet (15 mg total) by mouth  daily. 30 tablet 1   No current facility-administered medications on file prior to visit.     Review of Systems:  As per HPI- otherwise negative.   Physical Examination: There were no vitals filed for this visit. There were no vitals filed for this visit. There is no height or weight on file to calculate BMI. Ideal Body Weight:    Patient observed over video- she looks well No cough, wheezing, shortness of breath is noted She does not generally check her vital signs, but notes they were checked about a week ago and her blood pressure was fine  Assessment and Plan: Plantar fasciitis of left foot - Plan: meloxicam (MOBIC) 15 MG tablet  Virtual visit today to discuss plantar fasciitis of the left foot.  Patient has been bothered by this condition for several months, she has seen podiatry.  She called in today as her symptoms were worse this morning, she could not go to work and needs a work excuse.  I am glad to provide this for her, will send her MyChart account.  She would like to be off today and return tomorrow  Refilled her meloxicam  Advised her to stretch her foot before she gets out of bed in the morning, she might also try adding heel cups for additional padding  She will let me know if anything further is needed  Signed Lamar Blinks, MD

## 2019-09-15 ENCOUNTER — Encounter: Payer: Self-pay | Admitting: *Deleted

## 2019-09-15 ENCOUNTER — Encounter: Payer: Self-pay | Admitting: Podiatry

## 2019-09-16 ENCOUNTER — Other Ambulatory Visit: Payer: Self-pay | Admitting: Internal Medicine

## 2019-10-25 ENCOUNTER — Encounter (HOSPITAL_COMMUNITY): Payer: Self-pay | Admitting: Emergency Medicine

## 2019-10-25 ENCOUNTER — Emergency Department (HOSPITAL_COMMUNITY)
Admission: EM | Admit: 2019-10-25 | Discharge: 2019-10-25 | Disposition: A | Discharge status: Home / Self Care | Payer: 59 | Attending: Emergency Medicine | Admitting: Emergency Medicine

## 2019-10-25 ENCOUNTER — Other Ambulatory Visit: Payer: Self-pay

## 2019-10-25 DIAGNOSIS — Z79899 Other long term (current) drug therapy: Secondary | ICD-10-CM | POA: Insufficient documentation

## 2019-10-25 DIAGNOSIS — H9311 Tinnitus, right ear: Secondary | ICD-10-CM | POA: Insufficient documentation

## 2019-10-25 DIAGNOSIS — E038 Other specified hypothyroidism: Secondary | ICD-10-CM | POA: Insufficient documentation

## 2019-10-25 DIAGNOSIS — H9201 Otalgia, right ear: Secondary | ICD-10-CM | POA: Insufficient documentation

## 2019-10-25 NOTE — Discharge Instructions (Addendum)
Follow-up with your primary care provider in 2 to 3 days if symptoms do not improve.  Continue taking the Claritin.  You can also add a saline nasal spray such to help with your symptoms.

## 2019-10-25 NOTE — ED Triage Notes (Signed)
Per pt, states she has been having right ear pain and ringing for 2 weeks-has'nt been able to see PCP

## 2019-10-25 NOTE — ED Notes (Signed)
Pt refused discharge vital signs

## 2019-10-25 NOTE — ED Provider Notes (Signed)
[facility]-EMERGENCY DEPT Provider Note   CSN: [id number] Arrival date & time: 10/25/19  7026     History Chief Complaint  Patient presents with  . Otalgia    Diamond [name] is a 33 y.o. female presenting to the emergency department with complaint of right ear pain and ringing over the last 2 weeks.  She has intermittent mild ringing in her ear.  The pain in her ear has been more persistent though worse when she is at work around the loud machines.  She has had no changes in her hearing or hearing loss.  No headache or drainage, no fever, no sore throat.  Does endorse nasal congestion initially thought may be due to allergies.  She has been treating her symptoms with Claritin and eardrops not much relief.  She states this is occurred in the past.  The history is provided by the patient.       Past Medical History:  Diagnosis Date  . Anemia   . History of chicken pox   . Multiple thyroid nodules 04/2013   3 nodules per pt  . Pneumonia    hx of as child    Patient Active Problem List   Diagnosis Date Noted  . S/P thyroidectomy 12/08/2017  . Iron deficiency anemia 09/19/2016  . 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 Routine general medical examination at a health care facility 08/23/2013  . Cervical lesion 08/23/2013    Past Surgical History:  Procedure Laterality Date  . APPENDECTOMY     "teenager"  . FRACTURE SURGERY Right    "compound fracture of leg per pt"  . HARDWARE REMOVAL     a month after fracture surgery  . THYROIDECTOMY N/A 10/08/2013   Procedure: TOTAL THYROIDECTOMY;  Surgeon: Velora Heckler, MD;  Location: [facility];  Service: General;  Laterality: N/A;  . TONSILLECTOMY AND ADENOIDECTOMY  2012     OB History   No obstetric history on file.     Family History  Problem Relation Age of Onset  . Thyroid disease Mother        " had thyroid removed due to nodules per pt"  . Hyperlipidemia Father   . Hypertension Father   .  Diabetes Father   . Cancer Paternal Grandmother        lung  . Hyperlipidemia Paternal Grandmother   . Hypertension Paternal Grandmother   . Diabetes Paternal Grandmother     Social History   Tobacco Use  . Smoking status: Never Smoker  . Smokeless tobacco: Never Used  Substance Use Topics  . Alcohol use: Yes    Comment: occasional  . Drug use: No    Home Medications Prior to Admission medications   Medication Sig Start Date End Date Taking? Authorizing Provider  ferrous sulfate 325 (65 FE) MG EC tablet Take 650 mg by mouth daily.   Yes [provider]  levothyroxine (SYNTHROID) 175 MCG tablet TAKE 1 TABLET (175 MCG TOTAL) BY MOUTH DAILY BEFORE BREAKFAST. 09/16/19  Yes Carlus Pavlov, MD  meloxicam (MOBIC) 15 MG tablet Take 1 tablet (15 mg total) by mouth daily. Use as needed for foot pain Patient taking differently: Take 15 mg by mouth daily as needed for pain (for foot pain).  08/26/19  Yes Copland, Gwenlyn Found, MD  OVER THE COUNTER MEDICATION Place 3 drops in ear(s) daily as needed (for earache).   Yes [provider]    Allergies    Penicillins  Review of Systems  Review of Systems  HENT: Positive for congestion, ear pain and tinnitus. Negative for ear discharge, hearing loss and sore throat.   Eyes: Positive for discharge.  All other systems reviewed and are negative.   Physical Exam Updated Vital Signs BP (!) 182/105 (BP Location: Left Arm)   Pulse 87   Temp 98.5 F (36.9 C) (Oral)   Resp 16   SpO2 98%   Physical Exam Vitals and nursing note reviewed.  Constitutional:      General: She is not in acute distress.    Appearance: She is well-developed. She is obese. She is not ill-appearing.  HENT:     Head: Normocephalic and atraumatic.     Right Ear: Tympanic membrane, ear canal and external ear normal.     Left Ear: Tympanic membrane, ear canal and external ear normal.     Nose: Nose normal.     Mouth/Throat:     Mouth: Mucous  membranes are moist.     Pharynx: Oropharynx is clear.  Eyes:     Conjunctiva/sclera: Conjunctivae normal.  Cardiovascular:     Rate and Rhythm: Normal rate.  Pulmonary:     Effort: Pulmonary effort is normal.  Musculoskeletal:     Cervical back: Normal range of motion and neck supple. No tenderness.  Neurological:     Mental Status: She is alert.  Psychiatric:        Mood and Affect: Mood normal.        Behavior: Behavior normal.     ED Results / Procedures / Treatments   Labs (all labs ordered are listed, but only abnormal results are displayed) Labs Reviewed - No data to display  EKG None  Radiology No results found.  Procedures Procedures (including critical care time)  Medications Ordered in ED Medications - No data to display  ED Course  I have reviewed the triage vital signs and the nursing notes.  Pertinent labs & imaging results that were available during my care of the patient were reviewed by me and considered in my medical decision making (see chart for details).    MDM Rules/Calculators/A&P                      Patient presenting with right-sided otalgia and intermittent tinnitus, though when she is around loud machines at work.  No other infectious symptoms.  No hearing loss or vertigo.  ENT exam is normal, including normal TM and canal.  Patient has been treated with Claritin, recommend she add nasal sprays and follow-up with PCP if symptoms persist.  No evidence of infectious etiology at this time.  Patient is agreeable to plan and safe for discharge.  Discussed results, findings, treatment and follow up. Patient advised of return precautions. Patient verbalized understanding and agreed with plan.  Final Clinical Impression(s) / ED Diagnoses Final diagnoses:  Otalgia of right ear    Rx / DC Orders ED Discharge Orders    None       Ladavion Savitz, [place] N, PA-C 10/25/19 1143    Julianne Rice, MD 10/25/19 1422

## 2019-11-15 ENCOUNTER — Other Ambulatory Visit: Payer: Self-pay

## 2019-11-15 ENCOUNTER — Encounter: Payer: Self-pay | Admitting: Podiatry

## 2019-11-15 ENCOUNTER — Ambulatory Visit (INDEPENDENT_AMBULATORY_CARE_PROVIDER_SITE_OTHER): Payer: 59 | Admitting: Podiatry

## 2019-11-15 DIAGNOSIS — M76822 Posterior tibial tendinitis, left leg: Secondary | ICD-10-CM | POA: Diagnosis not present

## 2019-11-15 DIAGNOSIS — M659 Synovitis and tenosynovitis, unspecified: Secondary | ICD-10-CM

## 2019-11-15 DIAGNOSIS — M722 Plantar fascial fibromatosis: Secondary | ICD-10-CM

## 2019-11-15 MED ORDER — MELOXICAM 15 MG PO TABS: 15.0000 mg | ORAL_TABLET | Freq: Every day | ORAL | 2 refills | Status: DC | PRN

## 2019-11-18 ENCOUNTER — Other Ambulatory Visit: Payer: Self-pay

## 2019-11-18 ENCOUNTER — Ambulatory Visit (INDEPENDENT_AMBULATORY_CARE_PROVIDER_SITE_OTHER): Payer: 59 | Admitting: Family Medicine

## 2019-11-18 DIAGNOSIS — Z20822 Contact with and (suspected) exposure to covid-19: Secondary | ICD-10-CM

## 2019-11-18 NOTE — Progress Notes (Signed)
   HPI: 34 year old morbidly obese female presents to the office today for follow-up evaluation regarding left foot and ankle pain. She states the pain began flaring up last month secondary to working and being on her feet excessively. She states the injection she received at her last appointment helped alleviate her symptoms. Patient is here for further evaluation and treatment.   Past Medical History:  Diagnosis Date  . Anemia   . History of chicken pox   . Multiple thyroid nodules 04/2013   3 nodules per pt  . Pneumonia    hx of as child     Physical Exam: General: The patient is alert and oriented x3 in no acute distress.  Dermatology: Skin is warm, dry and supple bilateral lower extremities. Negative for open lesions or macerations.  Vascular: Palpable pedal pulses bilaterally. No edema or erythema noted. Capillary refill within normal limits.  Neurological: Epicritic and protective threshold grossly intact bilaterally.   Musculoskeletal Exam: Range of motion within normal limits to all pedal and ankle joints bilateral. Muscle strength 5/5 in all groups bilateral.  There continues to be some pain on palpation to the medial and anterior aspects of the left ankle.  Also pain on palpation along the posterior tibial tendon as it enters the medial longitudinal arch of the foot.  Assessment: 1. Synovitis left ankle-medial and anterior aspects 2. Posterior tibial tendinitis left   Plan of Care:  1. Patient evaluated.  2. Injection of 0.5 mLs Celestone Soluspan injected into the posterior tibial tendon sheath of the left foot.  3. Injection of 0.5 mLs Celestone Soluspan injected into the left ankle joint.  4. Continue using custom orthotics.  5. Refill prescription for Meloxicam provided to patient.  6. Note for work provided extending her restrictions.  7. Return to clinic as needed.   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      Felecia Shelling, DPM [facility]  Dr. Felecia Shelling, DPM    [address] [place], [place] [address]                Office [phone]  Fax [phone]

## 2019-11-18 NOTE — Progress Notes (Signed)
[place] [facility] [address] [place], [place] [address] [phone]  Date:  11/18/2019   Name:  Diamond [name]   DOB:  1986/01/27   MRN:  [id number]  PCP:  Debbrah Alar, NP    Chief Complaint: No chief complaint on file.   History of Present Illness:  Diamond [name] is a 34 y.o. very pleasant female patient who presents with the following:  Virtual visit today for concern of illnes  Pt seen by myself in October of this year-she is a usual patient of Lemar Livings Pt is at home, I am at office Pt has noted mild SOB with exertion- she is using flonase, states that she went to the ER for this issue on December 21.  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 S/P thyroidectomy 12/08/2017  . Iron deficiency anemia 09/19/2016  . 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 Routine general medical examination at a health care facility 08/23/2013  . Cervical lesion 08/23/2013    Past Medical History:  Diagnosis Date  . Anemia   . History of chicken pox   . Multiple thyroid nodules 04/2013   3 nodules per pt  . Pneumonia    hx of as child    Past Surgical History:  Procedure Laterality Date  . APPENDECTOMY     "teenager"  . FRACTURE SURGERY Right    "compound fracture of leg per pt"  . HARDWARE REMOVAL     a  month after fracture surgery  . THYROIDECTOMY N/A 10/08/2013   Procedure: TOTAL THYROIDECTOMY;  Surgeon: Earnstine Regal, MD;  Location: [facility];  Service: General;  Laterality: N/A;  . TONSILLECTOMY AND ADENOIDECTOMY  2012    Social History   Tobacco Use  . Smoking status: Never Smoker  . Smokeless tobacco: Never Used  Substance Use Topics  . Alcohol use: Yes    Comment: occasional  . Drug use: No    Family History  Problem Relation Age of Onset  . Thyroid disease Mother        " had thyroid removed due to nodules per pt"  . Hyperlipidemia Father   . Hypertension Father   . Diabetes Father   . Cancer Paternal Grandmother        lung  . Hyperlipidemia Paternal Grandmother   . Hypertension Paternal Grandmother   . Diabetes Paternal Grandmother     Allergies  Allergen Reactions  . Penicillins Rash    Has patient had a PCN reaction causing immediate rash, facial/tongue/throat swelling, SOB or lightheadedness with hypotension: Y Has patient had a PCN reaction causing severe rash involving mucus membranes or skin necrosis: Y Has patient had a PCN reaction that required hospitalization: N Has patient had a PCN reaction occurring within the last  10 years: Y If all of the above answers are "NO", then may proceed with Cephalosporin use.     Medication list has been reviewed and updated.  Current Outpatient Medications on File Prior to Visit  Medication Sig Dispense Refill  . ferrous sulfate 325 (65 FE) MG EC tablet Take 650 mg by mouth daily.    Marland Kitchen levothyroxine (SYNTHROID) 175 MCG tablet TAKE 1 TABLET (175 MCG TOTAL) BY MOUTH DAILY BEFORE BREAKFAST. 90 tablet 1  . meloxicam (MOBIC) 15 MG tablet Take 1 tablet (15 mg total) by mouth daily as needed for pain (for foot pain). 30 tablet 2  . OVER THE COUNTER MEDICATION Place 3 drops in ear(s) daily as needed (for earache).     No current facility-administered medications on file prior to visit.    Review of Systems:  As per HPI-  otherwise negative.   Physical Examination: There were no vitals filed for this visit. There were no vitals filed for this visit. There is no height or weight on file to calculate BMI. Ideal Body Weight:    Patient observed over video monitor.  She looks well, no cough, wheezing, distress is noted  Assessment and Plan:  Close exposure to COVID-19 virus   Virtual visit today for concern of COVID-19 exposure.  The patient's close coworker has tested positive.  She notes mild shortness of breath with exertion, this is actually been ongoing for some time but could indicate COVID-19.  I encouraged her to be tested ASAP and went over testing procedure-she plans to be tested as soon as possible  If her test is negative, will evaluate shortness of breath further.  Seek immediate care if not doing okay  Signed Abbe Amsterdam, MD

## 2019-11-19 ENCOUNTER — Other Ambulatory Visit: Payer: 59

## 2019-11-19 ENCOUNTER — Ambulatory Visit: Payer: 59 | Attending: Internal Medicine

## 2019-11-19 DIAGNOSIS — Z20822 Contact with and (suspected) exposure to covid-19: Secondary | ICD-10-CM

## 2019-11-20 LAB — NOVEL CORONAVIRUS, NAA: SARS-CoV-2, NAA: NOT DETECTED

## 2019-11-26 ENCOUNTER — Telehealth: Payer: Self-pay

## 2019-11-26 MED ORDER — LEVOTHYROXINE SODIUM 175 MCG PO TABS: 175.0000 ug | ORAL_TABLET | Freq: Every day | ORAL | 1 refills | Status: DC

## 2019-11-26 NOTE — Telephone Encounter (Signed)
Patient called today to request an early refill for levothyroxine since she mistakenly left her rx at her parents house when she went to visit-patient would like a call back when this has been done

## 2019-11-26 NOTE — Addendum Note (Signed)
Addended by: Darliss Ridgel I on: 11/26/2019 02:06 PM   Modules accepted: Orders

## 2019-11-26 NOTE — Telephone Encounter (Signed)
RX sent

## 2019-12-07 ENCOUNTER — Other Ambulatory Visit: Payer: Self-pay

## 2019-12-09 ENCOUNTER — Encounter: Payer: Self-pay | Admitting: Internal Medicine

## 2019-12-09 ENCOUNTER — Other Ambulatory Visit: Payer: Self-pay

## 2019-12-09 ENCOUNTER — Ambulatory Visit (INDEPENDENT_AMBULATORY_CARE_PROVIDER_SITE_OTHER): Payer: 59 | Admitting: Internal Medicine

## 2019-12-09 VITALS — BP 120/60 | HR 82 | Ht 68.0 in | Wt 315.0 lb

## 2019-12-09 DIAGNOSIS — E890 Postprocedural hypothyroidism: Secondary | ICD-10-CM

## 2019-12-09 LAB — T4, FREE: Free T4: 1.24 ng/dL (ref 0.60–1.60)

## 2019-12-09 LAB — TSH: TSH: 3.31 u[IU]/mL (ref 0.35–4.50)

## 2019-12-09 NOTE — Progress Notes (Signed)
Patient ID: Diamond [name], female   DOB: 26-Aug-1986, 34 y.o.   MRN: [id number]  This visit occurred during the SARS-CoV-2 public health emergency.  Safety protocols were in place, including screening questions prior to the visit, additional usage of staff PPE, and extensive cleaning of exam room while observing appropriate contact time as indicated for disinfecting solutions.   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 Last visit 1 year ago.  Patient's hypothyroidism is uncontrolled due to incomplete compliance with her levothyroxine tablets.  She is doing a better job with taking the medication daily now.  Reviewed history: Patient was diagnosed with enlarged thyroid approximately 1 year ago - at [facility]. She had a thyroid ultrasound that showed 3 thyroid nodules. The 2 largest nodules were biopsied >> benign (10/2012).     She was describing a "lump" mid neck - for 2 years, + hoarseness for 2 years, + dysphagia - meats but also swallowing saliva laying down/no odynophagia, + SOB with lying down.  A Barium swallow showed mild impression from thyroid on Esophagus >> I referred her to surgery >> had total thyroidectomy 10/08/2013 >> final pathology shows nodular hyperplasia. Postoperative serum calcium level was normal at 9.6. She was started on Synthroid 100 mcg daily. Last visit with Dr Gerrit Friends was on 12/13/2013.  She is on levothyroxine 175 mcg (last dose increase 01/2019) daily, taken: - forgot 1 dose in last month - in am - fasting - at least 30 min from b'fast - no Ca, MVI, PPIs - + 2x Fe every other day, at night - + Biotin - stopped  Reviewed patient's TFTs: Lab Results  Component Value Date   TSH 4.32 04/01/2019   TSH 8.20 (H) 01/11/2019   TSH 8.24 (H) 12/08/2018   TSH 0.90 12/08/2017   TSH 2.41 09/18/2016   TSH 2.58 01/11/2016   TSH 8.87 (H) 09/19/2015   TSH 3.67 07/28/2015   TSH 9.45 (H) 01/06/2015   TSH 8.18 (H) 09/12/2014   FREET4 1.19 04/01/2019   FREET4 1.08 01/11/2019   FREET4 1.21 12/08/2018   FREET4 1.71 (H) 12/08/2017   FREET4 1.21 09/18/2016   FREET4 1.23 01/11/2016   FREET4 0.98 09/19/2015   FREET4 1.16 07/28/2015   FREET4 0.99 01/06/2015   FREET4 0.93 09/12/2014   Pt denies: - feeling nodules in neck - dysphagia - choking - SOB with lying down She continues to have hoarseness, which was present before the surgery.  She feels better with less fatigue after we increased her levothyroxine dose.  Pt also has a history of  iron deficiency anemia.  Has allergies >> on Flonase.  She works in the post office.  She is worried about her risk of COVID-19 as 109 people were Covid positive in her building of 900 employees.  ROS: Constitutional: + weight gain/+ weight loss, no fatigue, no subjective hyperthermia, no subjective hypothermia Eyes: no blurry vision, no xerophthalmia ENT: no sore throat, + see HPI Cardiovascular: no CP/no SOB/no palpitations/no leg swelling Respiratory: no cough/no SOB/no wheezing Gastrointestinal: no N/no V/no D/no C/no acid reflux Musculoskeletal: no muscle aches/no joint aches Skin: no rashes, no hair loss Neurological: no tremors/no numbness/no tingling/no dizziness  I reviewed pt's medications, allergies, PMH, social hx, family hx, and changes were documented in the history of present illness. Otherwise, unchanged from my initial visit note.  Past Medical History:  Diagnosis Date  . Anemia   . History of chicken pox   .  Multiple thyroid nodules 04/2013   3 nodules per pt  . Pneumonia    hx of as child   Past Surgical History:  Procedure Laterality Date  . APPENDECTOMY     "teenager"  . FRACTURE SURGERY Right    "compound fracture of leg per pt"  . HARDWARE REMOVAL     a month after fracture surgery  . THYROIDECTOMY N/A 10/08/2013   Procedure: TOTAL THYROIDECTOMY;  Surgeon: Velora Heckler, MD;   Location: [facility];  Service: General;  Laterality: N/A;  . TONSILLECTOMY AND ADENOIDECTOMY  2012   Social History   Socioeconomic History  . Marital status: Single    Spouse name: Not on file  . Number of children: Not on file  . Years of education: Not on file  . Highest education level: Not on file  Occupational History  . Not on file  Tobacco Use  . Smoking status: Never Smoker  . Smokeless tobacco: Never Used  Substance and Sexual Activity  . Alcohol use: Yes    Comment: occasional  . Drug use: No  . Sexual activity: Yes    Partners: Male    Birth control/protection: None  Other Topics Concern  . 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 Difficulty of Paying Living Expenses: Not on file  Food Insecurity:   . Worried About Programme researcher, broadcasting/film/video in the Last Year: Not on file  . Ran Out of Food in the Last Year: Not on file  Transportation Needs:   . Lack of Transportation (Medical): Not on file  . Lack of Transportation (Non-Medical): Not on file  Physical Activity:   . Days of Exercise per Week: Not on file  . Minutes of Exercise per Session: Not on file  Stress:   . Feeling of Stress : Not on file  Social Connections:   . Frequency of Communication with Friends and Family: Not on file  . Frequency of Social Gatherings with Friends and Family: Not on file  . Attends Religious Services: Not on file  . Active Member of Clubs or Organizations: Not on file  . Attends Banker Meetings: Not on file  . Marital Status: Not on file  Intimate Partner Violence:   . Fear of  Current or Ex-Partner: Not on file  . Emotionally Abused: Not on file  . Physically Abused: Not on file  . Sexually Abused: Not on file   Current Outpatient Medications on File Prior to Visit  Medication Sig Dispense Refill  . ferrous sulfate 325 (65 FE) MG EC tablet Take 650 mg by mouth daily.    Marland Kitchen levothyroxine (SYNTHROID) 175 MCG tablet Take 1 tablet (175 mcg total) by mouth daily before breakfast. 90 tablet 1  . meloxicam (MOBIC) 15 MG tablet Take 1 tablet (15 mg total) by mouth daily as needed for pain (for foot pain). 30 tablet 2  . OVER THE COUNTER MEDICATION Place 3 drops in ear(s)  daily as needed (for earache).     No current facility-administered medications on file prior to visit.   Allergies  Allergen Reactions  . Penicillins Rash    Has patient had a PCN reaction causing immediate rash, facial/tongue/throat swelling, SOB or lightheadedness with hypotension: Y Has patient had a PCN reaction causing severe rash involving mucus membranes or skin necrosis: Y Has patient had a PCN reaction that required hospitalization: N Has patient had a PCN reaction occurring within the last 10 years: Y If all of the above answers are "NO", then may proceed with Cephalosporin use.    Family History  Problem Relation Age of Onset  . Thyroid disease Mother        " had thyroid removed due to nodules per pt"  . Hyperlipidemia Father   . Hypertension Father   . Diabetes Father   . Cancer Paternal Grandmother        lung  . Hyperlipidemia Paternal Grandmother   . Hypertension Paternal Grandmother   . Diabetes Paternal Grandmother     PE: BP 120/60   Pulse 82   Ht 5\' 8"  (1.727 m)   Wt (!) 315 lb (142.9 kg)   SpO2 98%   BMI 47.90 kg/m  Body mass index is 47.9 kg/m.  Wt Readings from Last 3 Encounters:  12/09/19 (!) 315 lb (142.9 kg)  06/14/19 (!) 325 lb (147.4 kg)  03/18/19 (!) 310 lb (140.6 kg)   Constitutional: overweight, in NAD Eyes: PERRLA, EOMI, no exophthalmos ENT:  moist mucous membranes, no neck masses, thyroidectomy scar healed, no cervical lymphadenopathy Cardiovascular: RRR, No MRG Respiratory: CTA B Gastrointestinal: abdomen soft, NT, ND, BS+ Musculoskeletal: no deformities, strength intact in all 4 Skin: moist, warm, no rashes Neurological: no tremor with outstretched hands, DTR normal in all 4  ASSESSMENT: 1.  Postsurgical hypothyroidism  - thyroid ultrasound at [facility] 09/09/2012:  Right lobe is 7.3 x 2.5 x 2.2 cm. On the right, there is a solid 2.2 x 1.2 x 1.6 cm mass. The second mass measures 1.0 x 1.2 x 1.2 cm. A third mass measures 1.6 x 1.1 x 1.3 cm. There are several subcentimeter masses on the right as well.  Left lobe is 8.0 x 3.8 x 2.8 cm There is a mass in the left lobe measuring 5.0 x 4.9 x 3.9 cm, with few tiny calcifications.  Isthmus is 8 mm in thickness.  In the isthmus, there is a mass measuring 1.8 x 1.7 x 1.4 cm all masses are solid. Enlarged thyroid with multiple solid masses.  - thyroid FNA (10/2012):   left sided 4 cm nodule: benign, with Hurthle cell and cystic changes  right-sided 2 cm nodule: benign, with Hurthle cell and cystic changes  - Ba swallow 08/27/2013: Slight impression on the anterior lower cervical esophagus, possibly related to the patient's known multinodular goiter. Brief sticking of the 13 mm barium tablet in the cervical esophagus.  - total thyroidectomy 10/08/2013 >> benign pathology  Plan: 1.  Postsurgical hypothyroidism - Pt has a history of total thyroidectomy in 10/2013 due to compressive multinodular goiter with dysphagia and hoarseness and also barium swallow showing thyroid compression on the esophagus.  Final pathology was benign. - latest thyroid labs reviewed with pt >> normal after increasing the levothyroxine dose: Lab Results  Component Value Date   TSH 4.32 04/01/2019   - she continues on levothyroxine 175 mcg daily - pt feels good on this dose, and fatigue has  improved after  the increase in dose. - we discussed about taking the thyroid hormone every day, with water, >30 minutes before breakfast, separated by >4 hours from acid reflux medications, calcium, iron, multivitamins. Pt. is taking it correctly. - will check thyroid tests today: TSH and fT4 - If labs are abnormal, she will need to return for repeat TFTs in 1.5 months  - OTW, I will see her back in a year  Office Visit on 12/09/2019  Component Date Value Ref Range Status  . TSH 12/09/2019 3.31  0.35 - 4.50 uIU/mL Final  . Free T4 12/09/2019 1.24  0.60 - 1.60 ng/dL Final   Comment: Specimens from patients who are undergoing biotin therapy and /or ingesting biotin supplements may contain high levels of biotin.  The higher biotin concentration in these specimens interferes with this Free T4 assay.  Specimens that contain high levels  of biotin may cause false high results for this Free T4 assay.  Please interpret results in light of the total clinical presentation of the patient.     Normal TFTs.  Diamond Kingdom, MD PhD [facility]

## 2019-12-09 NOTE — Patient Instructions (Signed)
Please stop at the lab.  Please continue Synthroid 175 mcg daily.  Take the thyroid hormone every day, with water, >30 minutes before breakfast, separated by >4 hours from acid reflux medications, calcium, iron, multivitamins.  Please return in 1 year.   

## 2020-02-11 ENCOUNTER — Telehealth: Payer: Self-pay | Admitting: Podiatry

## 2020-02-11 NOTE — Telephone Encounter (Signed)
I'm just trying to see if I have any balance there that I owe. When you get a chance, please call me at [phone]. Thank you.

## 2020-03-13 ENCOUNTER — Encounter: Payer: Self-pay | Admitting: Family

## 2020-03-14 NOTE — Telephone Encounter (Signed)
Patient advised Diamond [name] is not in office today, I offered her an appointment today with one of the other providers, she said she can probably come tomorrow before 10 am. I offered her an appointment with Ramon Dredge at 8 or 8:40 am, she said she will call back.

## 2020-03-27 ENCOUNTER — Ambulatory Visit: Payer: 59 | Admitting: Podiatry

## 2020-06-01 ENCOUNTER — Encounter: Payer: Self-pay | Admitting: Family

## 2020-08-06 ENCOUNTER — Other Ambulatory Visit: Payer: Self-pay | Admitting: Internal Medicine

## 2020-09-08 ENCOUNTER — Other Ambulatory Visit: Payer: Self-pay

## 2020-09-08 ENCOUNTER — Ambulatory Visit (INDEPENDENT_AMBULATORY_CARE_PROVIDER_SITE_OTHER): Payer: 59 | Admitting: Family

## 2020-09-08 ENCOUNTER — Encounter: Payer: Self-pay | Admitting: Family

## 2020-09-08 VITALS — BP 156/78 | HR 82 | Temp 98.7°F | Resp 16 | Ht 68.0 in | Wt 319.0 lb

## 2020-09-08 DIAGNOSIS — R809 Proteinuria, unspecified: Secondary | ICD-10-CM

## 2020-09-08 DIAGNOSIS — D500 Iron deficiency anemia secondary to blood loss (chronic): Secondary | ICD-10-CM | POA: Diagnosis not present

## 2020-09-08 DIAGNOSIS — I517 Cardiomegaly: Secondary | ICD-10-CM | POA: Diagnosis not present

## 2020-09-08 DIAGNOSIS — E890 Postprocedural hypothyroidism: Secondary | ICD-10-CM

## 2020-09-08 DIAGNOSIS — I10 Essential (primary) hypertension: Secondary | ICD-10-CM

## 2020-09-08 MED ORDER — AMLODIPINE BESYLATE 5 MG PO TABS: 5.0000 mg | ORAL_TABLET | Freq: Every day | ORAL | 3 refills | Status: DC

## 2020-09-08 NOTE — Patient Instructions (Signed)
Please complete lab work prior to leaving. Continue to work on healthy/low sodium diet, exercise and weight loss. Begin amlodipine 5mg  once daily for blood pressure.

## 2020-09-08 NOTE — Progress Notes (Signed)
Subjective:    Patient ID: Diamond [name], female    DOB: 07/29/1986, 34 y.o.   MRN: [id number]  HPI  Patient is a 33 yr old female who presents today for follow up.  Hypothyroid- maintained on synthroid 175 mcg once daily.  Lab Results  Component Value Date   TSH 3.31 12/09/2019   Cardiomegaly- last visit she was referred for 2D echo. Echo has not been scheduled. She states she was uninsured at the time but has since obtained insurance.  Proteinuria- noted during her ED visit. Follow up urinalysis was negative for protein.  BP Readings from Last 3 Encounters:  09/08/20 (!) 156/78  12/09/19 120/60  10/25/19 (!) 182/105   Iron deficiency anemia-  Lab Results  Component Value Date   WBC 3.6 (L) 06/09/2019   HGB 10.2 (L) 06/09/2019   HCT 34.4 (L) 06/09/2019   MCV 82.7 06/09/2019   PLT 244 06/09/2019   Obesity- cutting out juice, soda, cutting down to once a week fast food. She is walking.  Wt Readings from Last 3 Encounters:  09/08/20 (!) 319 lb (144.7 kg)  12/09/19 (!) 315 lb (142.9 kg)  06/14/19 (!) 325 lb (147.4 kg)      Review of Systems     Past Medical History:  Diagnosis Date  . Anemia   . History of chicken pox   . Multiple thyroid nodules 04/2013   3 nodules per pt  . Pneumonia    hx of as child     Social History   Socioeconomic History  . Marital status: Single    Spouse name: Not on file  . Number of children: Not on file  . Years of education: Not on file  . Highest education level: Not on file  Occupational History  . Not on file  Tobacco Use  . Smoking status: Never Smoker  . Smokeless tobacco: Never Used  Vaping Use  . Vaping Use: Never used  Substance and Sexual Activity  . Alcohol use: Yes    Comment: occasional  . Drug use: No  . Sexual activity: Yes    Partners: Male    Birth control/protection: None  Other Topics Concern  . 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 Difficulty of Paying Living Expenses: Not on file  Food Insecurity:   . Worried About Programme researcher, broadcasting/film/video in the Last Year: Not on file  . Ran Out of Food in the Last Year: Not on file  Transportation Needs:   . Lack of Transportation (Medical): Not on file  . Lack of Transportation (Non-Medical): Not on file  Physical Activity:   . Days of Exercise per Week: Not on file  . Minutes of Exercise per Session: Not on file  Stress:   . Feeling of Stress : Not on file  Social Connections:   . Frequency of Communication with Friends and Family: Not on file  . Frequency of Social Gatherings with  Friends and Family: Not on file  . Attends Religious Services: Not on file  . Active Member of Clubs or Organizations: Not on file  . Attends Banker Meetings: Not on file  . Marital Status: Not on file  Intimate Partner Violence:   . Fear of Current or Ex-Partner: Not on file  . Emotionally Abused: Not on file  . Physically Abused: Not on file  . Sexually Abused: Not on file    Past Surgical History:  Procedure Laterality Date  . APPENDECTOMY     "teenager"  . FRACTURE SURGERY Right    "compound fracture of leg per pt"  . HARDWARE REMOVAL     a month after fracture surgery  . THYROIDECTOMY N/A 10/08/2013   Procedure: TOTAL THYROIDECTOMY;  Surgeon: Velora Heckler, MD;  Location: [facility];  Service: General;  Laterality: N/A;  . TONSILLECTOMY AND ADENOIDECTOMY  2012    Family History  Problem Relation Age of Onset  . Thyroid disease Mother        " had thyroid removed due to nodules per pt"  . Hyperlipidemia Father   . Hypertension  Father   . Diabetes Father   . Cancer Paternal Grandmother        lung  . Hyperlipidemia Paternal Grandmother   . Hypertension Paternal Grandmother   . Diabetes Paternal Grandmother     Allergies  Allergen Reactions  . Penicillins Rash    Has patient had a PCN reaction causing immediate rash, facial/tongue/throat swelling, SOB or lightheadedness with hypotension: Y Has patient had a PCN reaction causing severe rash involving mucus membranes or skin necrosis: Y Has patient had a PCN reaction that required hospitalization: N Has patient had a PCN reaction occurring within the last 10 years: Y If all of the above answers are "NO", then may proceed with Cephalosporin use.     Current Outpatient Medications on File Prior to Visit  Medication Sig Dispense Refill  . ferrous sulfate 325 (65 FE) MG EC tablet Take 650 mg by mouth daily.    Marland Kitchen levothyroxine (SYNTHROID) 175 MCG tablet TAKE 1 TABLET (175 MCG TOTAL) BY MOUTH DAILY BEFORE BREAKFAST. 90 tablet 1  . meloxicam (MOBIC) 15 MG tablet Take 1 tablet (15 mg total) by mouth daily as needed for pain (for foot pain). 30 tablet 2   No current facility-administered medications on file prior to visit.    BP (!) 156/78 (BP Location: Right Arm, Patient Position: Sitting, Cuff Size: Large)   Pulse 82   Temp 98.7 F (37.1 C) (Oral)   Resp 16   Ht 5\' 8"  (1.727 m)   Wt (!) 319 lb (144.7 kg)   SpO2 100%   BMI 48.50 kg/m    Objective:   Physical Exam Constitutional:      Appearance: She is well-developed.  Cardiovascular:     Rate and Rhythm: Normal rate and regular rhythm.     Heart sounds: Normal heart sounds. No murmur heard.   Pulmonary:     Effort: Pulmonary effort is normal. No respiratory distress.     Breath sounds: Normal breath sounds. No wheezing.  Psychiatric:        Behavior: Behavior normal.        Thought Content: Thought content normal.        Judgment: Judgment normal.           Assessment & Plan:  HTN- bp  above goal. Recommended addition of amlodipine 5mg  once daily.  Hypothyroid-  clinically stable on synthroid. Continue synthroid , obtain follow up TSH.  Cardiomegaly- will re-order 2-D echo.  Proteinuria- resolved.    Morbid obesity- discussed importance of low sodium diet, exercise and weight loss.  Iron deficiency anemia- on iron supplement. Obtain follow up cbc and iron studies.  This visit occurred during the SARS-CoV-2 public health emergency.  Safety protocols were in place, including screening questions prior to the visit, additional usage of staff PPE, and extensive cleaning of exam room while observing appropriate contact time as indicated for disinfecting solutions.

## 2020-09-09 LAB — COMPREHENSIVE METABOLIC PANEL
AG Ratio: 1.8 (calc) (ref 1.0–2.5)
ALT: 13 U/L (ref 6–29)
AST: 12 U/L (ref 10–30)
Albumin: 4.4 g/dL (ref 3.6–5.1)
Alkaline phosphatase (APISO): 67 U/L (ref 31–125)
BUN: 12 mg/dL (ref 7–25)
CO2: 24 mmol/L (ref 20–32)
Calcium: 9.8 mg/dL (ref 8.6–10.2)
Chloride: 106 mmol/L (ref 98–110)
Creat: 0.5 mg/dL (ref 0.50–1.10)
Globulin: 2.5 g/dL (calc) (ref 1.9–3.7)
Glucose, Bld: 86 mg/dL (ref 65–99)
Potassium: 4.3 mmol/L (ref 3.5–5.3)
Sodium: 140 mmol/L (ref 135–146)
Total Bilirubin: 0.3 mg/dL (ref 0.2–1.2)
Total Protein: 6.9 g/dL (ref 6.1–8.1)

## 2020-09-09 LAB — CBC WITH DIFFERENTIAL/PLATELET
Absolute Monocytes: 624 cells/uL (ref 200–950)
Basophils Absolute: 8 cells/uL (ref 0–200)
Basophils Relative: 0.1 %
Eosinophils Absolute: 123 cells/uL (ref 15–500)
Eosinophils Relative: 1.6 %
HCT: 36.4 % (ref 35.0–45.0)
Hemoglobin: 11.7 g/dL (ref 11.7–15.5)
Lymphs Abs: 2587 cells/uL (ref 850–3900)
MCH: 26.6 pg — ABNORMAL LOW (ref 27.0–33.0)
MCHC: 32.1 g/dL (ref 32.0–36.0)
MCV: 82.7 fL (ref 80.0–100.0)
MPV: 10.1 fL (ref 7.5–12.5)
Monocytes Relative: 8.1 %
Neutro Abs: 4358 cells/uL (ref 1500–7800)
Neutrophils Relative %: 56.6 %
Platelets: 361 10*3/uL (ref 140–400)
RBC: 4.4 10*6/uL (ref 3.80–5.10)
RDW: 14.6 % (ref 11.0–15.0)
Total Lymphocyte: 33.6 %
WBC: 7.7 10*3/uL (ref 3.8–10.8)

## 2020-09-09 LAB — TSH: TSH: 1.53 mIU/L

## 2020-09-09 LAB — IRON: Iron: 34 ug/dL — ABNORMAL LOW (ref 40–190)

## 2020-09-09 LAB — FERRITIN: Ferritin: 29 ng/mL (ref 16–154)

## 2020-09-13 ENCOUNTER — Encounter: Payer: Self-pay | Admitting: Family

## 2020-09-14 NOTE — Telephone Encounter (Signed)
Sherri- could you please check on echo scheduling for this patient?

## 2020-09-21 IMAGING — CR LEFT FOOT - COMPLETE 3+ VIEW
3 series · 3 of 3 positions shown · non-contrast
Comparison: None.

CLINICAL DATA: Left foot pain/injury

EXAM:
LEFT FOOT - COMPLETE 3+ VIEW

[x foot ap left]
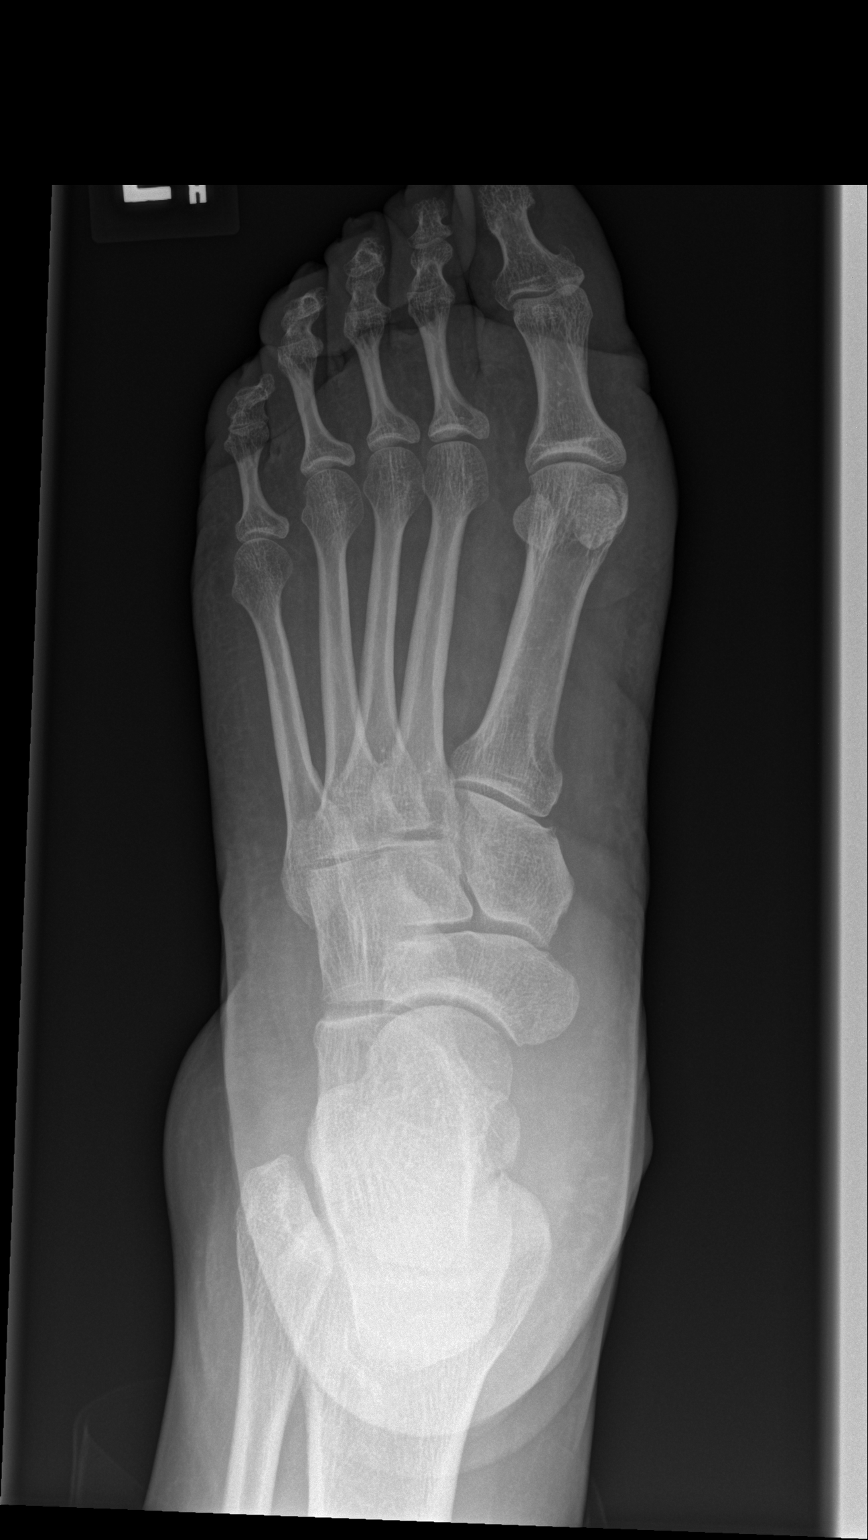

[x foot obl left]
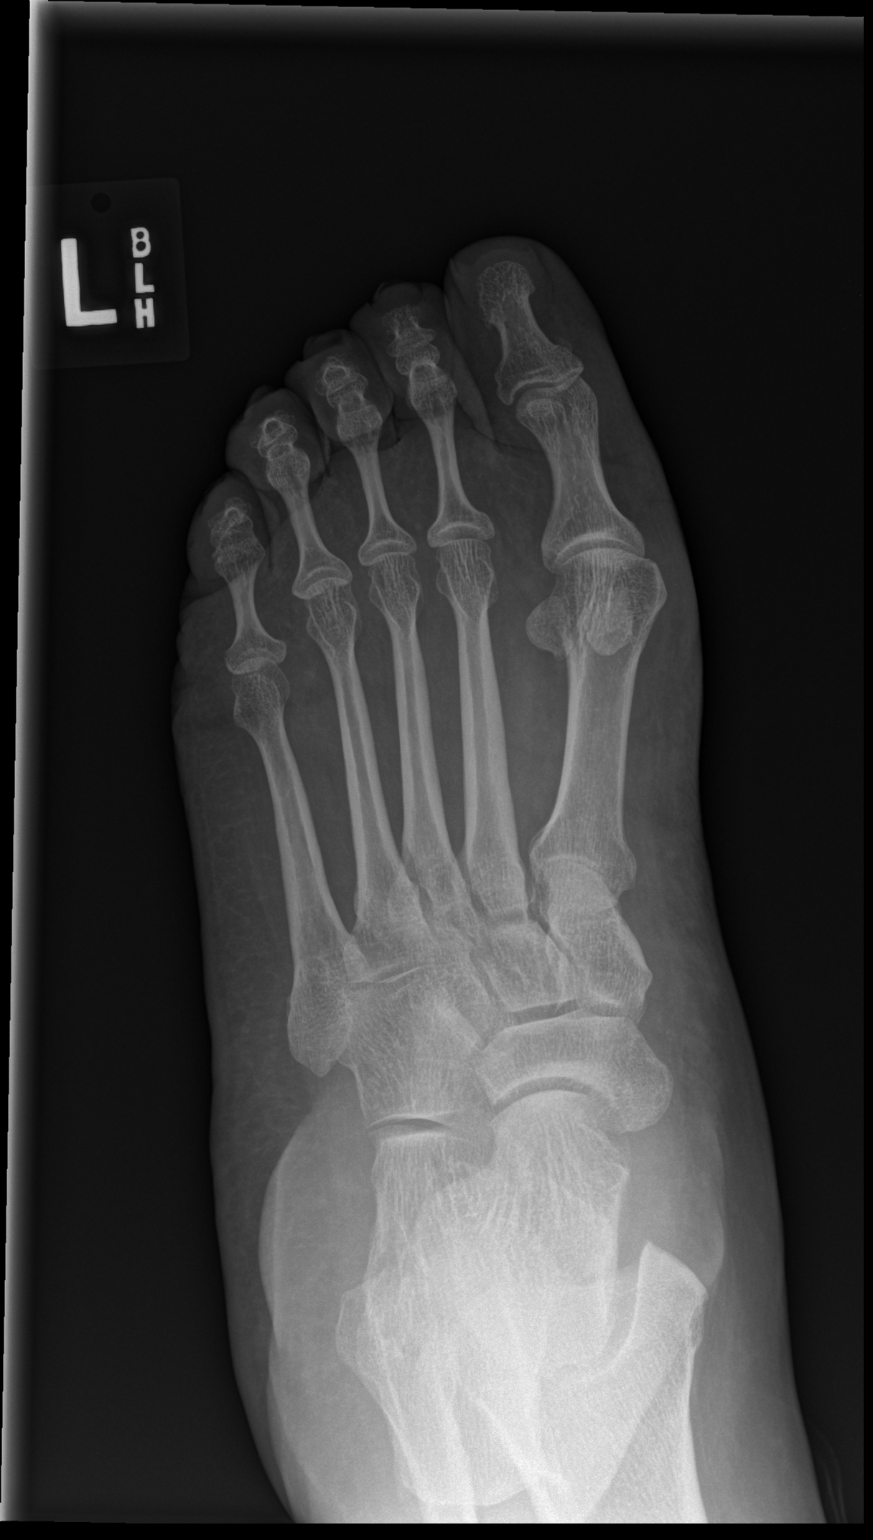

[x foot lat left]
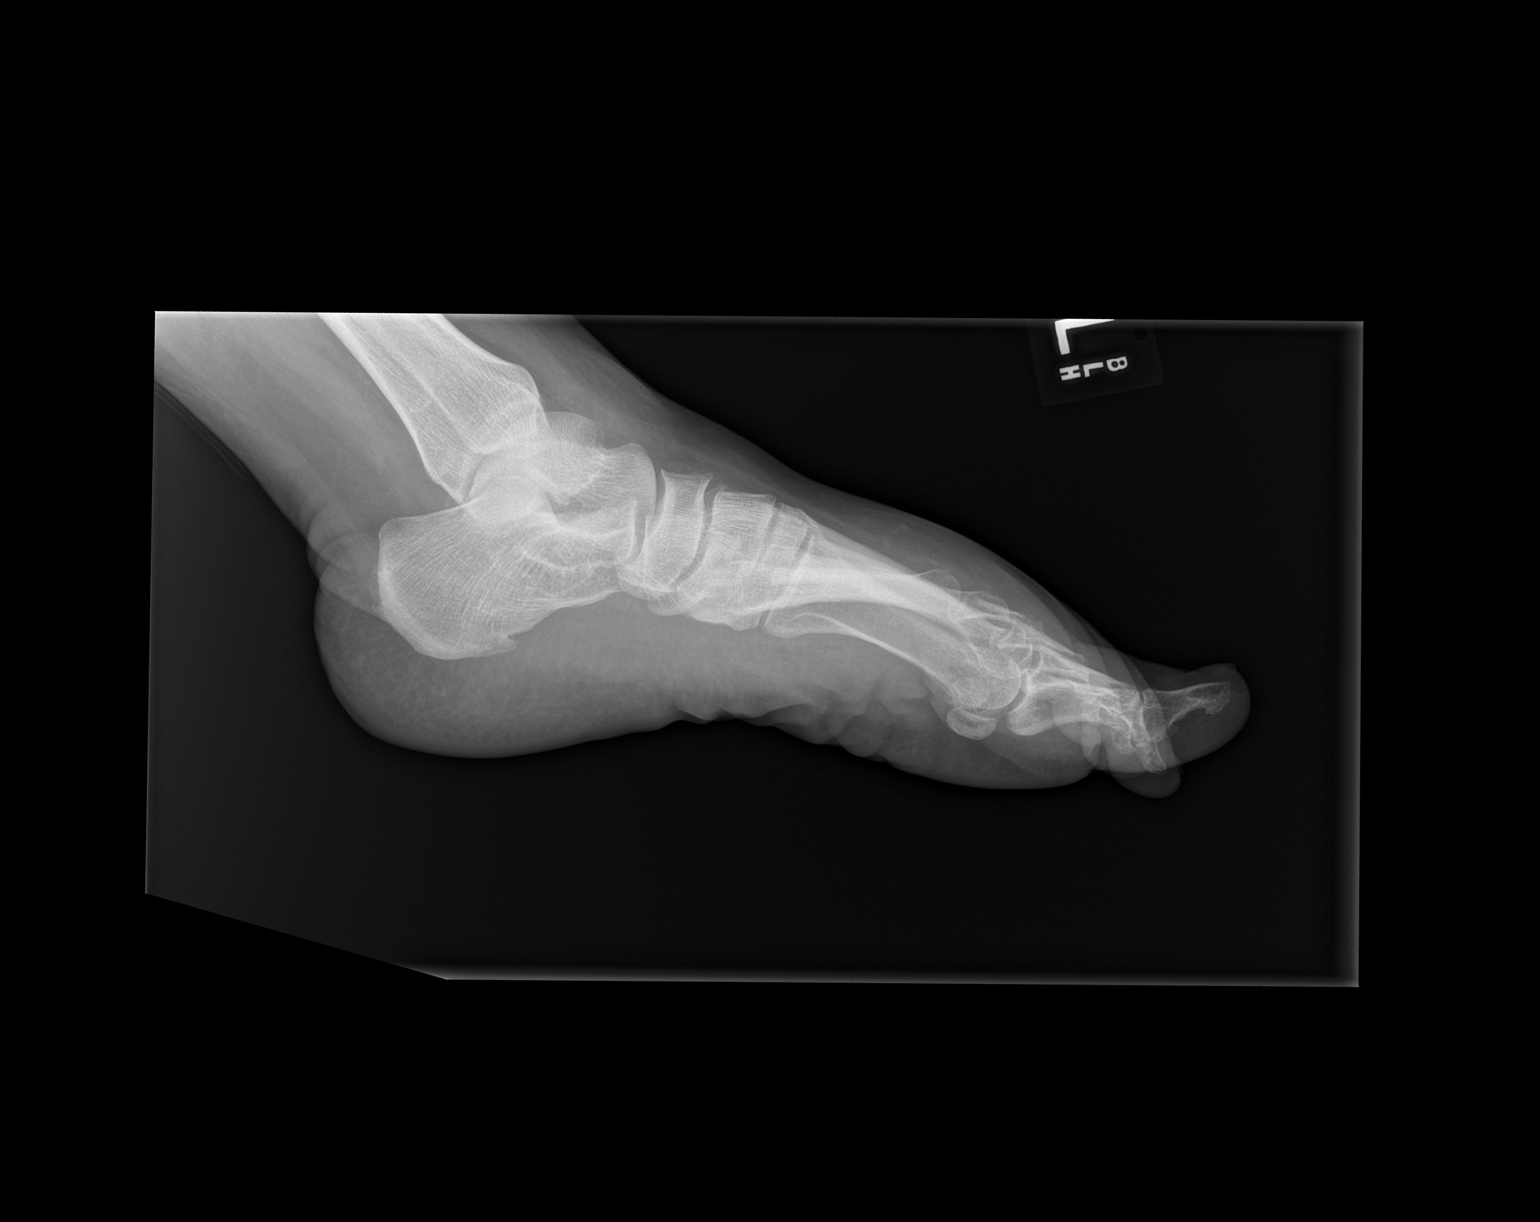

[3 of 3 positions shown; findings below may reference images not displayed]

FINDINGS: No fracture or dislocation is seen.

The joint spaces are preserved.

Mild soft tissue swelling along the dorsal forefoot.
IMPRESSION: No fracture or dislocation is seen.

## 2020-10-06 ENCOUNTER — Telehealth: Payer: Self-pay | Admitting: Family

## 2020-10-06 ENCOUNTER — Other Ambulatory Visit: Payer: Self-pay

## 2020-10-06 ENCOUNTER — Ambulatory Visit (HOSPITAL_COMMUNITY): Payer: 59 | Attending: Cardiovascular Disease

## 2020-10-06 DIAGNOSIS — I517 Cardiomegaly: Secondary | ICD-10-CM

## 2020-10-06 LAB — ECHOCARDIOGRAM COMPLETE
Area-P 1/2: 4.15 cm2
S' Lateral: 3.4 cm

## 2020-10-06 NOTE — Telephone Encounter (Signed)
See mychart.  

## 2021-01-10 ENCOUNTER — Other Ambulatory Visit: Payer: Self-pay | Admitting: Family

## 2021-01-12 ENCOUNTER — Other Ambulatory Visit: Payer: Self-pay | Admitting: Family

## 2021-01-12 MED ORDER — AMLODIPINE BESYLATE 5 MG PO TABS: 5.0000 mg | ORAL_TABLET | Freq: Every day | ORAL | 0 refills | Status: DC

## 2021-02-06 ENCOUNTER — Other Ambulatory Visit: Payer: Self-pay | Admitting: Internal Medicine

## 2021-02-06 ENCOUNTER — Telehealth: Payer: Self-pay | Admitting: Internal Medicine

## 2021-02-06 DIAGNOSIS — E890 Postprocedural hypothyroidism: Secondary | ICD-10-CM

## 2021-02-06 MED ORDER — LEVOTHYROXINE SODIUM 175 MCG PO TABS: 175.0000 ug | ORAL_TABLET | Freq: Every day | ORAL | 0 refills | Status: DC

## 2021-02-06 NOTE — Telephone Encounter (Signed)
Rx sent to preferred pharmacy.

## 2021-02-06 NOTE — Telephone Encounter (Signed)
MEDICATION: Levothyroxine 175 mcg  PHARMACY:  [facility] on [address]  HAS THE PATIENT CONTACTED THEIR PHARMACY?  yes  IS THIS A 90 DAY SUPPLY :   IS PATIENT OUT OF MEDICATION: yes  IF NOT; HOW MUCH IS LEFT:   LAST APPOINTMENT DATE: @4 /03/2021  NEXT APPOINTMENT DATE:@4 /28/2022  DO WE HAVE YOUR PERMISSION TO LEAVE A DETAILED MESSAGE?:  OTHER COMMENTS:    **Let patient know to contact pharmacy at the end of the day to make sure medication is ready. **  ** Please notify patient to allow 48-72 hours to process**  **Encourage patient to contact the pharmacy for refills or they can request refills through [facility]**

## 2021-02-11 IMAGING — CR CHEST - 2 VIEW
2 series · 2 of 2 positions shown · non-contrast
Comparison: 02/03/2019

CLINICAL DATA: Fever

EXAM:
CHEST - 2 VIEW

[w chest pa]
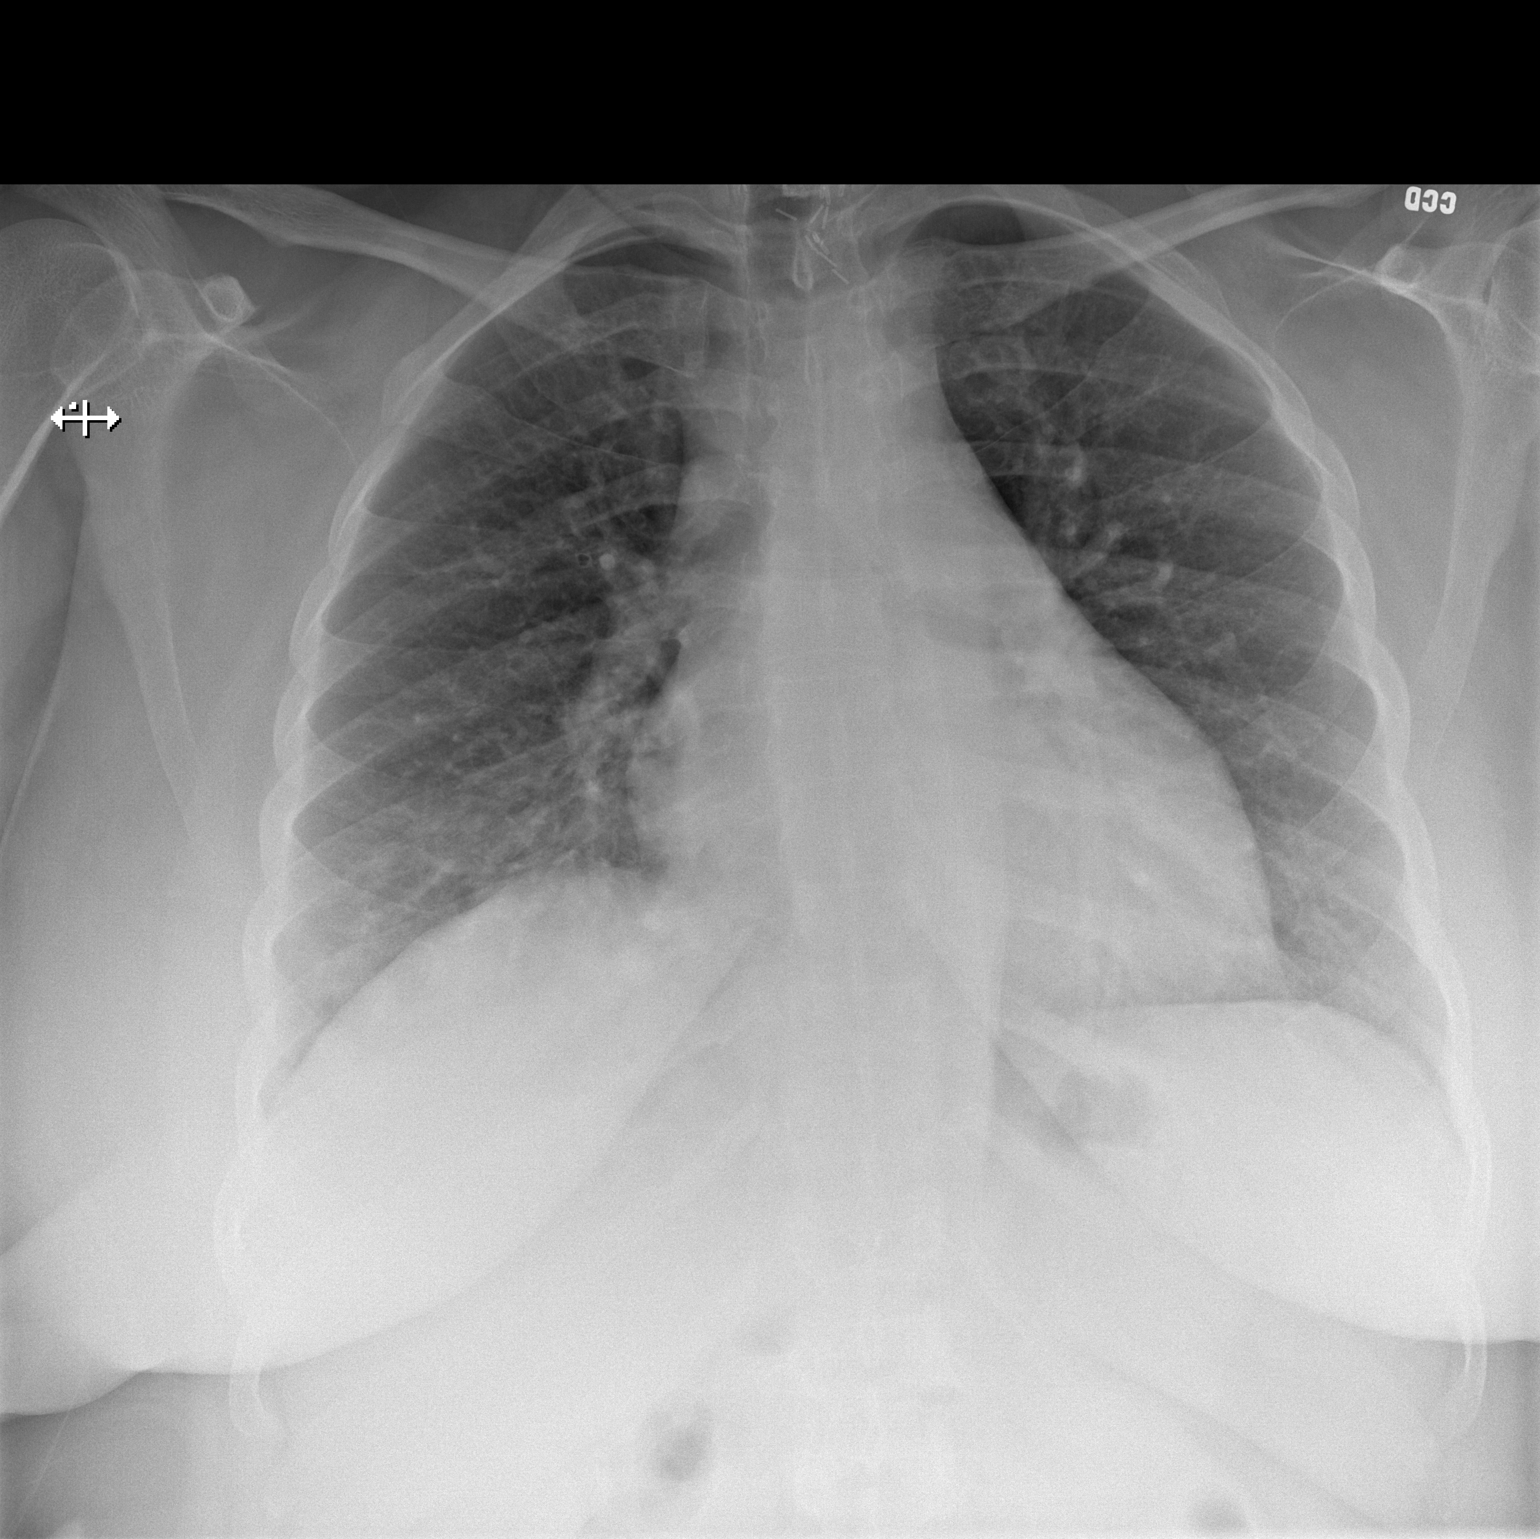

[w chest lat]
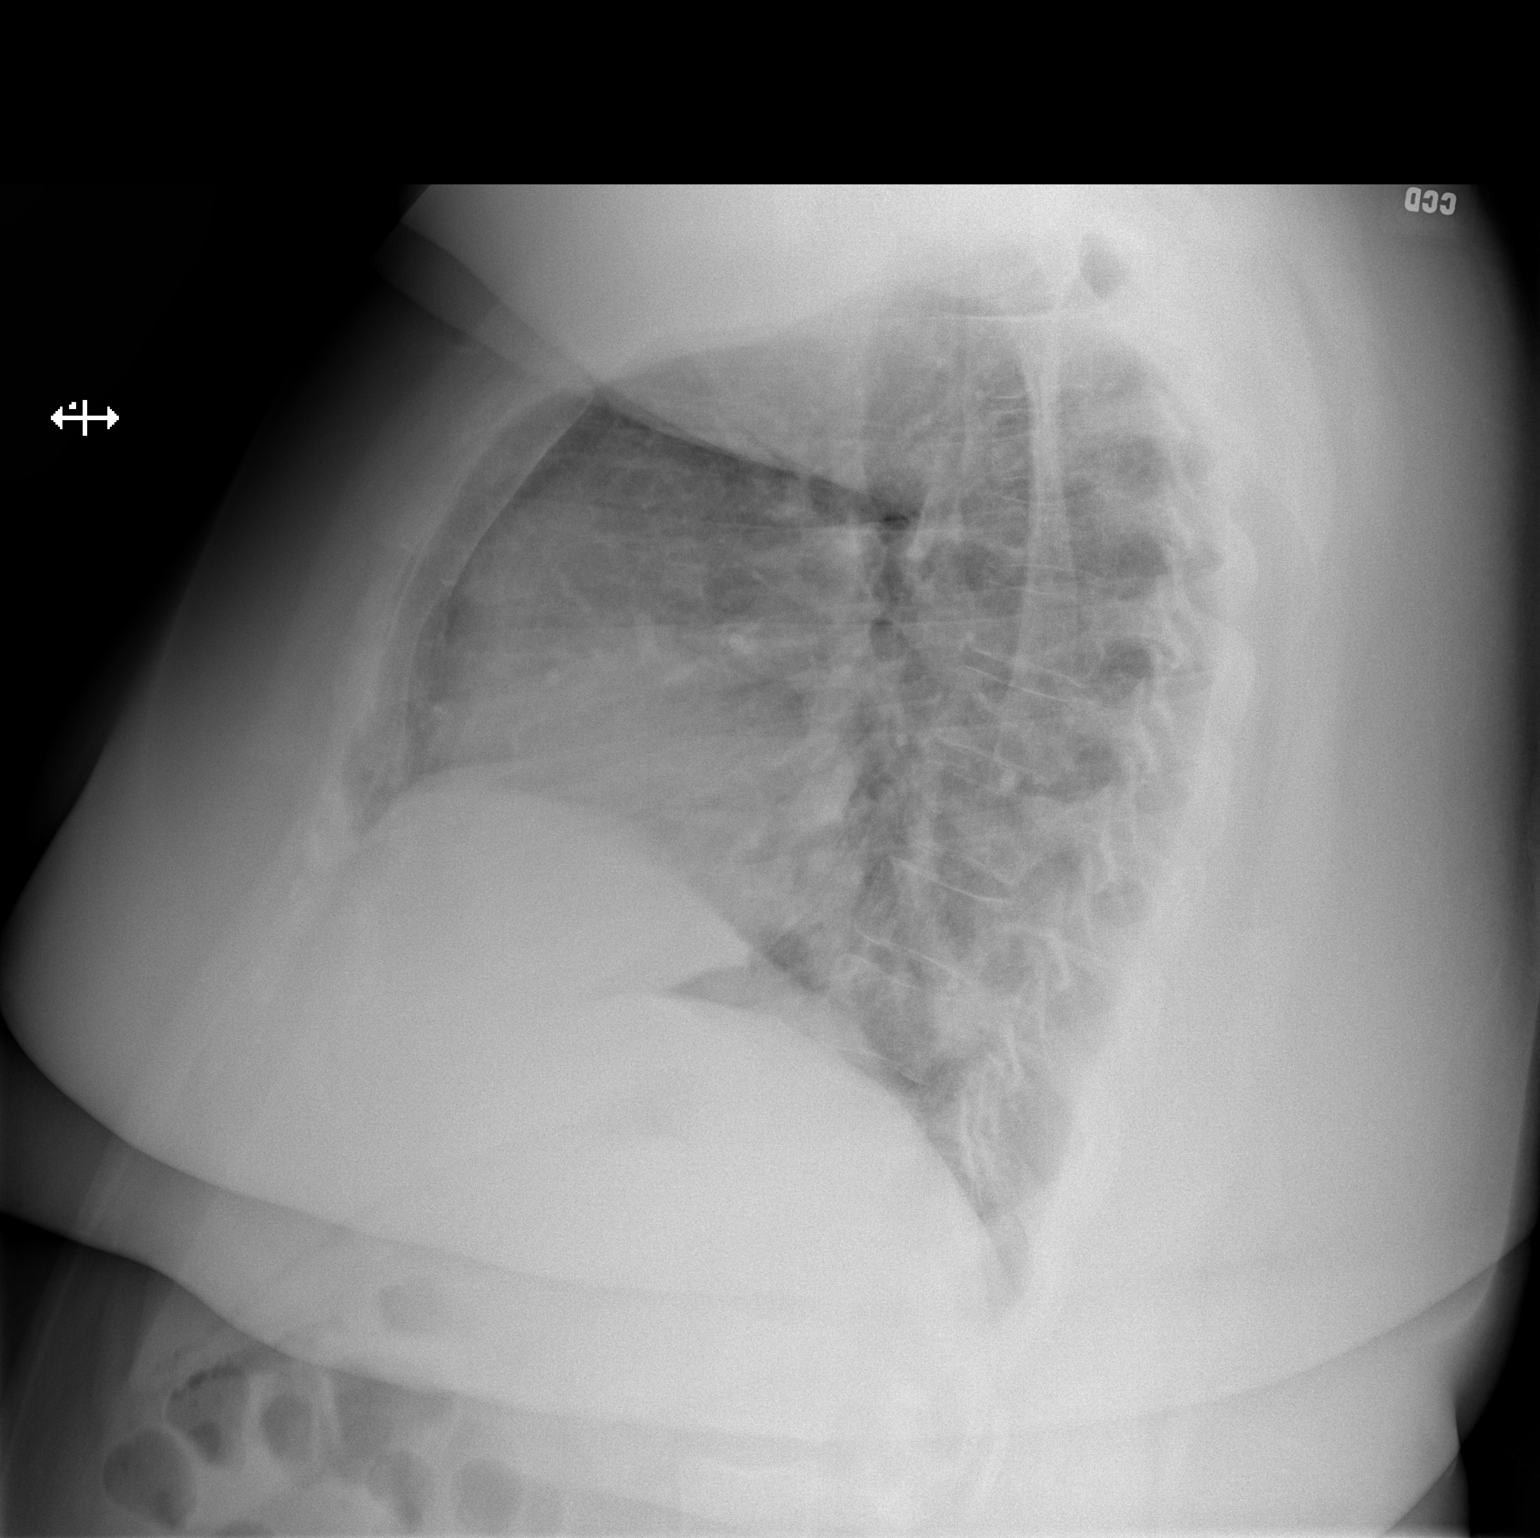

[2 of 2 positions shown; findings below may reference images not displayed]

FINDINGS: Cardiomegaly. Both lungs are clear. The visualized skeletal
structures are unremarkable.
IMPRESSION: Cardiomegaly without acute abnormality of the lungs.

## 2021-02-12 ENCOUNTER — Telehealth: Payer: Self-pay

## 2021-02-12 NOTE — Telephone Encounter (Signed)
Caller states she needs a note from the office, saying she can go back to work today. She had a headache and had to leave work yesterday. No symptoms today

## 2021-02-12 NOTE — Telephone Encounter (Signed)
Lvm for patient to call back. We can only provide a going back to work note if we see the patient or if we take the patient out of work. She has not been seen since [facility] 2021

## 2021-02-13 NOTE — Telephone Encounter (Signed)
Lvm again for patient to call back about this note

## 2021-03-01 ENCOUNTER — Ambulatory Visit: Payer: 59 | Admitting: Internal Medicine

## 2021-03-07 ENCOUNTER — Other Ambulatory Visit: Payer: Self-pay | Admitting: Internal Medicine

## 2021-03-07 ENCOUNTER — Other Ambulatory Visit: Payer: Self-pay | Admitting: Family

## 2021-03-07 DIAGNOSIS — E890 Postprocedural hypothyroidism: Secondary | ICD-10-CM

## 2021-03-23 ENCOUNTER — Telehealth: Payer: Self-pay | Admitting: Family

## 2021-03-23 ENCOUNTER — Other Ambulatory Visit: Payer: Self-pay

## 2021-03-23 ENCOUNTER — Encounter: Payer: Self-pay | Admitting: Family

## 2021-03-23 ENCOUNTER — Ambulatory Visit (INDEPENDENT_AMBULATORY_CARE_PROVIDER_SITE_OTHER): Payer: 59 | Admitting: Family

## 2021-03-23 VITALS — BP 134/71 | HR 76 | Temp 98.4°F | Resp 12 | Ht 68.0 in | Wt 297.8 lb

## 2021-03-23 DIAGNOSIS — D649 Anemia, unspecified: Secondary | ICD-10-CM

## 2021-03-23 DIAGNOSIS — R0789 Other chest pain: Secondary | ICD-10-CM | POA: Insufficient documentation

## 2021-03-23 DIAGNOSIS — I10 Essential (primary) hypertension: Secondary | ICD-10-CM

## 2021-03-23 DIAGNOSIS — D509 Iron deficiency anemia, unspecified: Secondary | ICD-10-CM | POA: Diagnosis not present

## 2021-03-23 DIAGNOSIS — E890 Postprocedural hypothyroidism: Secondary | ICD-10-CM

## 2021-03-23 HISTORY — DX: Essential (primary) hypertension: I10

## 2021-03-23 HISTORY — DX: Other chest pain: R07.89

## 2021-03-23 LAB — BASIC METABOLIC PANEL
BUN: 11 mg/dL (ref 6–23)
CO2: 26 mEq/L (ref 19–32)
Calcium: 9.4 mg/dL (ref 8.4–10.5)
Chloride: 105 mEq/L (ref 96–112)
Creatinine, Ser: 0.58 mg/dL (ref 0.40–1.20)
GFR: 116.06 mL/min (ref >60.00)
Glucose, Bld: 120 mg/dL — ABNORMAL HIGH (ref 70–99)
Potassium: 3.8 mEq/L (ref 3.5–5.1)
Sodium: 140 mEq/L (ref 135–145)

## 2021-03-23 LAB — CBC WITH DIFFERENTIAL/PLATELET
Basophils Absolute: 0 10*3/uL (ref 0.0–0.1)
Basophils Relative: 0.1 % (ref 0.0–3.0)
Eosinophils Absolute: 0.1 10*3/uL (ref 0.0–0.7)
Eosinophils Relative: 1.6 % (ref 0.0–5.0)
HCT: 35.5 % — ABNORMAL LOW (ref 36.0–46.0)
Hemoglobin: 11.5 g/dL — ABNORMAL LOW (ref 12.0–15.0)
Lymphocytes Relative: 41.4 % (ref 12.0–46.0)
Lymphs Abs: 2.3 10*3/uL (ref 0.7–4.0)
MCHC: 32.5 g/dL (ref 30.0–36.0)
MCV: 83.1 fl (ref 78.0–100.0)
Monocytes Absolute: 0.3 10*3/uL (ref 0.1–1.0)
Monocytes Relative: 5.3 % (ref 3.0–12.0)
Neutro Abs: 2.9 10*3/uL (ref 1.4–7.7)
Neutrophils Relative %: 51.6 % (ref 43.0–77.0)
Platelets: 356 10*3/uL (ref 150.0–400.0)
RBC: 4.27 Mil/uL (ref 3.87–5.11)
RDW: 15.6 % — ABNORMAL HIGH (ref 11.5–15.5)
WBC: 5.7 10*3/uL (ref 4.0–10.5)

## 2021-03-23 NOTE — Assessment & Plan Note (Signed)
No current chest pain. Will refer to cardiology for further evaluation. She is advised to go to the ER if she develops recurrent chest pain.

## 2021-03-23 NOTE — Patient Instructions (Signed)
Please complete lab work prior to leaving. Keep up the good work with low sodium diet and weight loss. Schedule a follow up appointment with cardiology.

## 2021-03-23 NOTE — Assessment & Plan Note (Signed)
Clinically stable on synthroid. She has upcoming appointment with Endo.

## 2021-03-23 NOTE — Progress Notes (Signed)
Subjective:   By signing my name below, I, Shehryar Baig, attest that this documentation has been prepared under the direction and in the presence of Sandford Craze NP. 03/23/2021      Patient ID: Diamond [name], female    DOB: 1986/09/29, 35 y.o.   MRN: [id number]  No chief complaint on file.   HPI Patient is in today for a office visit.  She was complains of left chest pain while working on a specific machines at her work place. She denies current chest pain or chest pain at any other time. She stopped taking 15 mg Meloxicam daily PO because her foot pain has improved.  Diet- She stopped eating sausage 3x weekly to manage her sodium intake. Pap Smear-She has an upcoming appointment with her OBGYN and is getting her Pap Smear updated. Anemia- Her hemoglobin has improved since her last visit. Hypertension- She is taking 5 mg amlodipine daily PO and is managing well. She denies any leg swelling.   Past Medical History:  Diagnosis Date  . Anemia   . History of chicken pox   . Multiple thyroid nodules 04/2013   3 nodules per pt  . Pneumonia    hx of as child    Past Surgical History:  Procedure Laterality Date  . APPENDECTOMY     "teenager"  . FRACTURE SURGERY Right    "compound fracture of leg per pt"  . HARDWARE REMOVAL     a month after fracture surgery  . THYROIDECTOMY N/A 10/08/2013   Procedure: TOTAL THYROIDECTOMY;  Surgeon: Velora Heckler, MD;  Location: [facility];  Service: General;  Laterality: N/A;  . TONSILLECTOMY AND ADENOIDECTOMY  2012    Family History  Problem Relation Age of Onset  . Thyroid disease Mother        " had thyroid removed due to nodules per pt"  . Hyperlipidemia Father   . Hypertension Father   . Diabetes Father   . Cancer Paternal Grandmother        lung  . Hyperlipidemia Paternal Grandmother   . Hypertension Paternal Grandmother   . Diabetes Paternal Grandmother     Social History   Socioeconomic History  . Marital status:  Single    Spouse name: Not on file  . Number of children: Not on file  . Years of education: Not on file  . Highest education level: Not on file  Occupational History  . Not on file  Tobacco Use  . Smoking status: Never Smoker  . Smokeless tobacco: Never Used  Vaping Use  . Vaping Use: Never used  Substance and Sexual Activity  . Alcohol use: Yes    Comment: occasional  . Drug use: No  . Sexual activity: Yes    Partners: Male    Birth control/protection: None  Other Topics Concern  . 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 amLODipine (NORVASC) 5 MG tablet TAKE 1 TABLET (5 MG TOTAL) BY MOUTH DAILY. 30 tablet 0  . ferrous sulfate 325 (65 FE) MG EC tablet Take 650 mg by mouth daily.    Marland Kitchen levothyroxine (SYNTHROID) 175 MCG tablet TAKE 1 TABLET BY MOUTH DAILY BEFORE BREAKFAST. 35 tablet 0  . meloxicam (MOBIC) 15 MG tablet Take 1 tablet (15 mg total) by mouth daily as needed for pain (for foot pain). 30 tablet 2   No facility-administered medications prior to visit.    Allergies  Allergen Reactions  . Penicillins Rash    Has patient had a PCN reaction causing immediate rash, facial/tongue/throat swelling, SOB or lightheadedness with hypotension: Y Has patient had a PCN reaction causing severe rash involving mucus  membranes or skin necrosis: Y Has patient had a PCN reaction that required hospitalization: N Has patient had a PCN reaction occurring within the last 10 years: Y If all of the above answers are "NO", then may proceed with Cephalosporin use.     Review of Systems  Cardiovascular: Negative for leg swelling.       Objective:    Physical Exam Constitutional:      Appearance: Normal appearance.  HENT:     Head: Normocephalic and atraumatic.     Right Ear: External ear normal.     Left Ear: External ear normal.  Eyes:     Extraocular Movements: Extraocular movements intact.     Pupils: Pupils are equal, round, and reactive to light.  Cardiovascular:     Rate and Rhythm: Normal rate and regular rhythm.     Pulses: Normal pulses.     Heart sounds: Normal heart sounds. No murmur heard. No gallop.   Pulmonary:     Effort: Pulmonary effort is normal. No respiratory distress.     Breath sounds: Normal breath sounds. No wheezing, rhonchi or rales.  Skin:    General: Skin is warm and dry.  Neurological:     Mental Status: She is alert and oriented to person, place, and time.  Psychiatric:        Behavior: Behavior normal.     There were no vitals taken for this visit. Wt Readings from Last 3 Encounters:  09/08/20 (!) 319 lb (144.7 kg)  12/09/19 (!) 315 lb (142.9 kg)  06/14/19 (!) 325 lb (147.4 kg)    Diabetic Foot Exam - Simple   No data filed    Lab Results  Component Value Date   WBC 7.7 09/08/2020   HGB 11.7 09/08/2020   HCT 36.4 09/08/2020   PLT 361 09/08/2020   GLUCOSE 86 09/08/2020   CHOL 164 08/23/2013   TRIG 44 08/23/2013   HDL 45 08/23/2013   LDLCALC 110 (H) 08/23/2013   ALT 13 09/08/2020   AST 12 09/08/2020   NA 140 09/08/2020   K 4.3 09/08/2020   CL 106 09/08/2020   CREATININE 0.50 09/08/2020   BUN 12 09/08/2020   CO2 24 09/08/2020   TSH 1.53 09/08/2020   HGBA1C 6.3 07/16/2019    Lab Results  Component Value Date   TSH 1.53 09/08/2020   Lab  Results  Component Value Date   WBC 7.7 09/08/2020   HGB 11.7 09/08/2020   HCT 36.4 09/08/2020   MCV 82.7  09/08/2020   PLT 361 09/08/2020   Lab Results  Component Value Date   NA 140 09/08/2020   K 4.3 09/08/2020   CO2 24 09/08/2020   GLUCOSE 86 09/08/2020   BUN 12 09/08/2020   CREATININE 0.50 09/08/2020   BILITOT 0.3 09/08/2020   ALKPHOS 54 06/09/2019   AST 12 09/08/2020   ALT 13 09/08/2020   PROT 6.9 09/08/2020   ALBUMIN 3.9 06/09/2019   CALCIUM 9.8 09/08/2020   ANIONGAP 8 06/09/2019   Lab Results  Component Value Date   CHOL 164 08/23/2013   Lab Results  Component Value Date   HDL 45 08/23/2013   Lab Results  Component Value Date   LDLCALC 110 (H) 08/23/2013   Lab Results  Component Value Date   TRIG 44 08/23/2013   Lab Results  Component Value Date   CHOLHDL 3.6 08/23/2013   Lab Results  Component Value Date   HGBA1C 6.3 07/16/2019       Assessment & Plan:   Problem List Items Addressed This Visit   None      No orders of the defined types were placed in this encounter.   I, Sandford Craze NP, personally preformed the services described in this documentation.  All medical record entries made by the scribe were at my direction and in my presence.  I have reviewed the chart and discharge instructions (if applicable) and agree that the record reflects my personal performance and is accurate and complete. 03/23/2021   I,Shehryar Baig,acting as a Neurosurgeon for Lemont Fillers, NP.,have documented all relevant documentation on the behalf of Lemont Fillers, NP,as directed by  Lemont Fillers, NP while in the presence of Lemont Fillers, NP.   Shehryar [employer]

## 2021-03-23 NOTE — Assessment & Plan Note (Signed)
Continues iron supplement.  Obtain follow up CBC.

## 2021-03-23 NOTE — Telephone Encounter (Signed)
Please call OB/GYN and request a copy of most recent pap.

## 2021-03-23 NOTE — Telephone Encounter (Signed)
Records request sent to [facility]

## 2021-03-23 NOTE — Assessment & Plan Note (Signed)
BP stable. Continue amlodipine 5mg once daily.  

## 2021-03-30 ENCOUNTER — Other Ambulatory Visit: Payer: Self-pay | Admitting: Family

## 2021-03-30 LAB — HM PAP SMEAR: HM Pap smear: NEGATIVE

## 2021-04-08 ENCOUNTER — Other Ambulatory Visit: Payer: Self-pay | Admitting: Internal Medicine

## 2021-04-08 DIAGNOSIS — E890 Postprocedural hypothyroidism: Secondary | ICD-10-CM

## 2021-04-12 ENCOUNTER — Ambulatory Visit (INDEPENDENT_AMBULATORY_CARE_PROVIDER_SITE_OTHER): Payer: 59 | Admitting: Internal Medicine

## 2021-04-12 ENCOUNTER — Other Ambulatory Visit: Payer: Self-pay

## 2021-04-12 ENCOUNTER — Encounter: Payer: Self-pay | Admitting: Internal Medicine

## 2021-04-12 VITALS — BP 138/82 | HR 67 | Ht 68.0 in | Wt 293.8 lb

## 2021-04-12 DIAGNOSIS — E890 Postprocedural hypothyroidism: Secondary | ICD-10-CM

## 2021-04-12 LAB — T4, FREE: Free T4: 1.45 ng/dL (ref 0.60–1.60)

## 2021-04-12 LAB — TSH: TSH: 2.06 u[IU]/mL (ref 0.35–4.50)

## 2021-04-12 NOTE — Patient Instructions (Signed)
Please stop at the lab.  Please continue Synthroid 175 mcg daily.  Take the thyroid hormone every day, with water, >30 minutes before breakfast, separated by >4 hours from acid reflux medications, calcium, iron, multivitamins.  Please return in 1 year.

## 2021-04-12 NOTE — Progress Notes (Signed)
Patient ID: Diamond [name], female   DOB: 1985/12/24, 35 y.o.   MRN: [id number]  This visit occurred during the SARS-CoV-2 public health emergency.  Safety protocols were in place, including screening questions prior to the visit, additional usage of staff PPE, and extensive cleaning of exam room while observing appropriate contact time as indicated for disinfecting solutions.   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 Last visit 1 year and 4 months ago.  Interim history: She lost a significant weight since last visit after the adding down fast food (now only eating this once a week) and starting sweet drinks (sodas, juices).  From 315 at last OV >> 356 lbs 04/2021 >> 293 lbs now. She feels much better after losing weight. She was started on Amlodipine, 08/2020. She did not get infected with the COVID-19 infection.   Reviewed history: Patient was diagnosed with enlarged thyroid approximately 1 year ago - at [facility]. She had a thyroid ultrasound that showed 3 thyroid nodules. The 2 largest nodules were biopsied >> benign (10/2012).     She was describing a "lump" mid neck - for 2 years, + hoarseness for 2 years, + dysphagia - meats but also swallowing saliva laying down/no odynophagia, + SOB with lying down.  A Barium swallow showed mild impression from thyroid on Esophagus >> I referred her to surgery >> had total thyroidectomy 10/08/2013 >> final pathology shows nodular hyperplasia. Postoperative serum calcium level was normal at 9.6. She was started on Synthroid 100 mcg daily.  Dose was changed afterwards.  Last visit with Dr Gerrit Friends was on 12/13/2013.  She is on levothyroxine 175 mcg (last dose increase 01/2019) daily, taken: - in am - fasting - at least 30 min from b'fast - no Ca - + MVI midafternoon - no PPIs - + 2x Fe every other day, at night - no Biotin anymore  Reviewed patient's  TFTs: Lab Results  Component Value Date   TSH 1.53 09/08/2020   TSH 3.31 12/09/2019   TSH 4.32 04/01/2019   TSH 8.20 (H) 01/11/2019   TSH 8.24 (H) 12/08/2018   TSH 0.90 12/08/2017   TSH 2.41 09/18/2016   TSH 2.58 01/11/2016   TSH 8.87 (H) 09/19/2015   TSH 3.67 07/28/2015   FREET4 1.24 12/09/2019   FREET4 1.19 04/01/2019   FREET4 1.08 01/11/2019   FREET4 1.21 12/08/2018   FREET4 1.71 (H) 12/08/2017   FREET4 1.21 09/18/2016   FREET4 1.23 01/11/2016   FREET4 0.98 09/19/2015   FREET4 1.16 07/28/2015   FREET4 0.99 01/06/2015   Pt denies: - feeling nodules in neck - dysphagia - choking - SOB with lying down She continues to have hoarseness, which was present before the surgery.  Pt also has a history of  iron deficiency anemia.  She works in the post office.   ROS: Constitutional: no weight gain/++weight loss, no fatigue, no subjective hyperthermia, no subjective hypothermia Eyes: no blurry vision, no xerophthalmia ENT: no sore throat, + see HPI Cardiovascular: no CP/no SOB/no palpitations/no leg swelling Respiratory: no cough/no SOB/no wheezing Gastrointestinal: no N/no V/no D/no C/no acid reflux Musculoskeletal: no muscle aches/no joint aches Skin: no rashes, no hair loss Neurological: no tremors/no numbness/no tingling/no dizziness  I reviewed pt's medications, allergies, PMH, social hx, family hx, and changes were documented in the history of present illness. Otherwise, unchanged from my initial visit note.  Past Medical History:  Diagnosis Date   Anemia  History of chicken pox    Multiple thyroid nodules 04/2013   3 nodules per pt   Pneumonia    hx of as child   Past Surgical History:  Procedure Laterality Date   APPENDECTOMY     "teenager"   FRACTURE SURGERY Right    "compound fracture of leg per pt"   HARDWARE REMOVAL     a month after fracture surgery   THYROIDECTOMY N/A 10/08/2013   Procedure: TOTAL THYROIDECTOMY;  Surgeon: Velora Heckler, MD;   Location: [facility];  Service: General;  Laterality: N/A;   TONSILLECTOMY AND ADENOIDECTOMY  2012   Social History   Socioeconomic History   Marital status: Single    Spouse name: Not on file   Number of children: Not on file   Years of education: Not on file   Highest education level: Not on file  Occupational History   Not on file  Tobacco Use   Smoking status: Never   Smokeless tobacco: Never  Vaping Use   Vaping Use: Never used  Substance and Sexual Activity   Alcohol use: Yes    Comment: occasional   Drug use: No   Sexual activity: Yes    Partners: Male    Birth control/protection: None  Other Topics Concern   Not on file  Social History Narrative   Grew up in [place]   Bachelors in Social Work   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   Single   No children   Lives with her family   Enjoys movies, meals with family, water parks with family.Family relocated together from [place]      Regular exercise: walk 2 times a wk around the track   Caffeine use: sometimes            Social Determinants of Health   Financial Resource Strain: Not on file  Food Insecurity: Not on file  Transportation Needs: Not on file  Physical Activity: Not on file  Stress: Not on file  Social Connections: Not on file  Intimate Partner Violence: Not on file   Current Outpatient Medications on File Prior to Visit  Medication Sig Dispense Refill   amLODipine (NORVASC) 5 MG tablet TAKE 1 TABLET (5 MG TOTAL) BY MOUTH DAILY. 90 tablet 1   ferrous sulfate 325 (65 FE) MG EC tablet Take 650 mg by mouth daily.     levothyroxine (SYNTHROID) 175 MCG tablet TAKE 1 TABLET BY MOUTH DAILY BEFORE BREAKFAST. 35 tablet 0   No current facility-administered medications on file prior to visit.   Allergies  Allergen Reactions   Penicillins Rash    Has patient had a PCN reaction causing immediate rash, facial/tongue/throat swelling, SOB or lightheadedness with hypotension: Y Has patient had a PCN reaction  causing severe rash involving mucus membranes or skin necrosis: Y Has patient had a PCN reaction that required hospitalization: N Has patient had a PCN reaction occurring within the last 10 years: Y If all of the above answers are "NO", then may proceed with Cephalosporin use.    Family History  Problem Relation Age of Onset   Thyroid disease Mother        " had thyroid removed due to nodules per pt"   Hyperlipidemia Father    Hypertension Father    Diabetes Father    Cancer Paternal Grandmother        lung   Hyperlipidemia Paternal Grandmother    Hypertension Paternal Grandmother    Diabetes Paternal  Grandmother     PE: BP 138/82 (BP Location: Right Arm, Patient Position: Sitting, Cuff Size: Normal)   Pulse 67   Ht 5\' 8"  (1.727 m)   Wt 293 lb 12.8 oz (133.3 kg)   SpO2 98%   BMI 44.67 kg/m  Body mass index is 44.67 kg/m.  Wt Readings from Last 3 Encounters:  04/12/21 293 lb 12.8 oz (133.3 kg)  03/23/21 297 lb 12.8 oz (135.1 kg)  09/08/20 (!) 319 lb (144.7 kg)   Constitutional: overweight, in NAD Eyes: PERRLA, EOMI, no exophthalmos ENT: moist mucous membranes, no neck masses, no cervical lymphadenopathy Cardiovascular: RRR, No MRG Respiratory: CTA B Gastrointestinal: abdomen soft, NT, ND, BS+ Musculoskeletal: no deformities, strength intact in all 4 Skin: moist, warm, no rashes Neurological: no tremor with outstretched hands, DTR normal in all 4  ASSESSMENT: 1.  Postsurgical hypothyroidism  - thyroid ultrasound at [facility] 09/09/2012: Right lobe is 7.3 x 2.5 x 2.2 cm. On the right, there is a solid 2.2 x 1.2 x 1.6 cm mass. The second mass measures 1.0 x 1.2 x 1.2 cm. A third mass measures 1.6 x 1.1 x 1.3 cm. There are several subcentimeter masses on the right as well. Left lobe is 8.0 x 3.8 x 2.8 cm There is a mass in the left lobe measuring 5.0 x 4.9 x 3.9 cm, with few tiny calcifications. Isthmus is 8 mm in thickness.  In the isthmus, there is a mass  measuring 1.8 x 1.7 x 1.4 cm all masses are solid. Enlarged thyroid with multiple solid masses.  - thyroid FNA (10/2012):  left sided 4 cm nodule: benign, with Hurthle cell and cystic changes right-sided 2 cm nodule: benign, with Hurthle cell and cystic changes  - Ba swallow 08/27/2013: Slight impression on the anterior lower cervical esophagus, possibly related to the patient's known multinodular goiter. Brief sticking of the 13 mm barium tablet in the cervical esophagus.  - total thyroidectomy 10/08/2013 >> benign pathology  Plan: 1.  Postsurgical hypothyroidism -Patient with history of total thyroidectomy in 10/2013 due to progressing multinodular goiter with dysphagia and hoarseness and also barium swallow test showing thyroid compression of the esophagus. Final pathology was benign - latest thyroid labs reviewed with pt. >> normal: Lab Results  Component Value Date   TSH 1.53 09/08/2020  - she continues on LT4 175 mcg daily - pt feels good on this dose.  She lost a total of approximately 60 pounds in the last year and we discussed that after significant weight loss, we may need to adjust her levothyroxine dose - we discussed about taking the thyroid hormone every day, with water, >30 minutes before breakfast, separated by >4 hours from acid reflux medications, calcium, iron, multivitamins. Pt. is taking it correctly. - will check thyroid tests today: TSH and fT4 - If labs are abnormal, she will need to return for repeat TFTs in 1.5 months -Otherwise, I will see her back in a year, but I did advise him that if she continues to lose weight, we may need to recheck her TFTs in 6 months  Needs refills.  Component     Latest Ref Rng & Units 04/12/2021  T4,Free(Direct)     0.60 - 1.60 ng/dL 06/12/2021  TSH     2.58 - 5.27 uIU/mL 2.06  Thyroid tests are at goal.  7.82, MD PhD [facility] Endocrinology

## 2021-04-13 MED ORDER — LEVOTHYROXINE SODIUM 175 MCG PO TABS: ORAL_TABLET | ORAL | 3 refills | Status: DC

## 2021-04-27 ENCOUNTER — Ambulatory Visit: Payer: 59 | Admitting: Cardiology

## 2021-04-27 DIAGNOSIS — N841 Polyp of cervix uteri: Secondary | ICD-10-CM | POA: Insufficient documentation

## 2021-04-27 HISTORY — DX: Polyp of cervix uteri: N84.1

## 2021-05-30 DIAGNOSIS — D649 Anemia, unspecified: Secondary | ICD-10-CM | POA: Insufficient documentation

## 2021-05-30 DIAGNOSIS — Z8619 Personal history of other infectious and parasitic diseases: Secondary | ICD-10-CM | POA: Insufficient documentation

## 2021-05-30 DIAGNOSIS — J189 Pneumonia, unspecified organism: Secondary | ICD-10-CM | POA: Insufficient documentation

## 2021-05-30 HISTORY — DX: Personal history of other infectious and parasitic diseases: Z86.19

## 2021-06-01 ENCOUNTER — Encounter: Payer: Self-pay | Admitting: Cardiology

## 2021-06-01 ENCOUNTER — Other Ambulatory Visit: Payer: Self-pay

## 2021-06-01 ENCOUNTER — Ambulatory Visit (INDEPENDENT_AMBULATORY_CARE_PROVIDER_SITE_OTHER): Payer: 59 | Admitting: Cardiology

## 2021-06-01 VITALS — BP 130/82 | HR 78 | Ht 68.0 in | Wt 298.0 lb

## 2021-06-01 DIAGNOSIS — R0789 Other chest pain: Secondary | ICD-10-CM

## 2021-06-01 DIAGNOSIS — Z13220 Encounter for screening for lipoid disorders: Secondary | ICD-10-CM

## 2021-06-01 DIAGNOSIS — R079 Chest pain, unspecified: Secondary | ICD-10-CM | POA: Diagnosis not present

## 2021-06-01 DIAGNOSIS — I10 Essential (primary) hypertension: Secondary | ICD-10-CM

## 2021-06-01 HISTORY — DX: Morbid (severe) obesity due to excess calories: E66.01

## 2021-06-01 NOTE — Addendum Note (Signed)
Addended by: Eleonore Chiquito on: 06/01/2021 02:03 PM   Modules accepted: Orders

## 2021-06-01 NOTE — Addendum Note (Signed)
Addended by: Belva Crome R on: 06/01/2021 02:12 PM   Modules accepted: Orders

## 2021-06-01 NOTE — Patient Instructions (Signed)
Medication Instructions:  No medication changes. *If you need a refill on your cardiac medications before your next appointment, please call your pharmacy*   Lab Work: Your physician recommends that you return for lab work in: the next few days. You need to have labs done when you are fasting.  You can come Monday through Friday 8:30 am to 12:00 pm and 1:15 to 4:30. You do not need to make an appointment as the order has already been placed. The labs you are going to have done are BMET, CBC, TSH, LFT and Lipids.  If you have labs (blood work) drawn today and your tests are completely normal, you will receive your results only by: MyChart Message (if you have MyChart) OR A paper copy in the mail If you have any lab test that is abnormal or we need to change your treatment, we will call you to review the results.   Testing/Procedures:      Stress Echocardiogram Information Sheet                                                      Instructions:    1. You may take your morning medications the morning of the test  2. Light breakfast no caffeine  3. Dress prepared to exercise.  4. DO NOT use ANY caffeine or tobacco products 3 hours before appointment.  5. Please bring all current prescription medications.    Follow-Up: At [facility], you and your health needs are our priority.  As part of our continuing mission to provide you with exceptional heart care, we have created designated Provider Care Teams.  These Care Teams include your primary Cardiologist (physician) and Advanced Practice Providers (APPs -  Physician Assistants and Nurse Practitioners) who all work together to provide you with the care you need, when you need it.  We recommend signing up for the patient portal called "MyChart".  Sign up information is provided on this After Visit Summary.  MyChart is used to connect with patients for Virtual Visits (Telemedicine).  Patients are able to view lab/test results, encounter  notes, upcoming appointments, etc.  Non-urgent messages can be sent to your provider as well.   To learn more about what you can do with MyChart, go to [email or web address].    Your next appointment:   6 month(s)  The format for your next appointment:   In Person  Provider:   Belva Crome, MD   Other Instructions NA

## 2021-06-01 NOTE — Progress Notes (Signed)
Cardiology Office Note:    Date:  06/01/2021   ID:  [name], DOB 1986-09-09, MRN [id number]  PCP:  Sandford Craze, NP  Cardiologist:  Garwin Brothers, MD   Referring MD: Sandford Craze, NP    ASSESSMENT:    1. Chest pain of uncertain etiology   2. Screening cholesterol level   3. Morbid obesity (HCC)   4. Primary hypertension   5. Atypical chest pain    PLAN:    In order of problems listed above:  I discussed my findings with the patient at length and primary prevention stressed.  Importance of compliance with diet medication stressed and she vocalized understanding. Essential hypertension: Blood pressure stable and diet was emphasized.  Lifestyle modification urged.  Echo report was discussed with the patient at length. Chest pain: Atypical in nature however in view of risk factors and to reassure her we will do an exercise stress echo and she is agreeable.  She knows to go to the nearest emergency room for concerning symptoms. Morbid obesity: Weight reduction was stressed.  Diet was emphasized.  She promises to do better.  Restratification will be done by checking her lipids so we can advise her accordingly.  Risks of obesity explained. Patient will be seen in follow-up appointment in 6 months or earlier if the patient has any concerns    Medication Adjustments/Labs and Tests Ordered: Current medicines are reviewed at length with the patient today.  Concerns regarding medicines are outlined above.  Orders Placed This Encounter  Procedures   Basic metabolic panel   Hepatic function panel   Lipid panel   EKG 12-Lead   ECHOCARDIOGRAM STRESS TEST   No orders of the defined types were placed in this encounter.    History of Present Illness:    Diamond [name] is a 35 y.o. female who is being seen today for the evaluation of chest pain at the request of Sandford Craze, NP.  Patient is a pleasant 35 year old female.  She has past medical  history of essential hypertension.  She works for the [employer].  She tells me that she is well ambulatory.  At some point it was thought she had that she may have an enlarged heart therefore an echocardiogram was done and it was unremarkable.  I reviewed the report.  Patient mentions to me that she has some stabbing sensation at times.  No orthopnea or PND.  She mentions to me now that her blood pressure is under better control with amlodipine 2 symptoms have resolved some.  However she is worried about them and wants an evaluation and so was sent here.  No radiation to the neck or to the arms.  Symptoms do not get worse on exertion.  She is sexually active and sexual activity does not bring around the symptoms.  At the time of my evaluation, the patient is alert awake oriented and in no distress.  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   Iron deficiency anemia 09/19/2016   Multiple thyroid nodules 04/2013   3 nodules per pt   Pneumonia    hx of as child   Routine general medical examination at a health care facility 08/23/2013   S/P thyroidectomy 12/08/2017    Past Surgical History:  Procedure Laterality Date   APPENDECTOMY     "  teenager"   FRACTURE SURGERY Right    "compound fracture of leg per pt"   HARDWARE REMOVAL     a month after fracture surgery   THYROIDECTOMY N/A 10/08/2013   Procedure: TOTAL THYROIDECTOMY;  Surgeon: Velora Heckler, MD;  Location: [facility];  Service: General;  Laterality: N/A;   TONSILLECTOMY AND ADENOIDECTOMY  2012    Current Medications: Current Meds  Medication Sig   amLODipine (NORVASC) 5 MG tablet TAKE 1 TABLET (5 MG TOTAL) BY MOUTH DAILY.   ferrous sulfate 325 (65 FE) MG EC tablet Take 325 mg by mouth daily.   levothyroxine (SYNTHROID) 175 MCG tablet TAKE 1 TABLET BY MOUTH EVERY DAY BEFORE  BREAKFAST     Allergies:   Penicillins   Social History   Socioeconomic History   Marital status: Single    Spouse name: Not on file   Number of children: Not on file   Years of education: Not on file   Highest education level: Not on file  Occupational History   Not on file  Tobacco Use   Smoking status: Never   Smokeless tobacco: Never  Vaping Use   Vaping Use: Never used  Substance and Sexual Activity   Alcohol use: Yes    Comment: occasional   Drug use: No   Sexual activity: Yes    Partners: Male    Birth control/protection: None  Other Topics Concern   Not on file  Social History Narrative   Grew up in [place]   Bachelors in Social Work   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   Single   No children   Lives with her family   Enjoys movies, meals with family, water parks with family.Family relocated together from [place]      Regular exercise: walk 2 times a wk around the track   Caffeine use: sometimes            Social Determinants of Health   Financial Resource Strain: Not on file  Food Insecurity: Not on file  Transportation Needs: Not on file  Physical Activity: Not on file  Stress: Not on file  Social Connections: Not on file     Family History: The patient's family history includes Cancer in her paternal grandmother; Diabetes in her father and paternal grandmother; Hyperlipidemia in her father and paternal grandmother; Hypertension in her father and paternal grandmother; Thyroid disease in her mother.  ROS:   Please see the history of present illness.    All other systems reviewed and are negative.  EKGs/Labs/Other Studies Reviewed:    The following studies were reviewed today: IMPRESSIONS     1. Normal GLS -23.1. Left ventricular ejection fraction, by estimation,  is 60 to 65%. The left ventricle has normal function. The left ventricle  has no regional wall motion abnormalities. Left ventricular diastolic  parameters were normal.   2.  Right ventricular systolic function is normal. The right ventricular  size is normal.   3. Left atrial size was moderately dilated.   4. The mitral valve is normal in structure. Trivial mitral valve  regurgitation. No evidence of mitral stenosis.   5. The aortic valve is normal in structure. Aortic valve regurgitation is  not visualized. No aortic stenosis is present.   6. The inferior vena cava is normal in size with greater than 50%  respiratory variability, suggesting right atrial pressure of 3 mmHg.    Recent Labs: 09/08/2020: ALT 13 03/23/2021: BUN 11; Creatinine, Ser  0.58; Hemoglobin 11.5; Platelets 356.0; Potassium 3.8; Sodium 140 04/12/2021: TSH 2.06  Recent Lipid Panel    Component Value Date/Time   CHOL 164 08/23/2013 0923   TRIG 44 08/23/2013 0923   HDL 45 08/23/2013 0923   CHOLHDL 3.6 08/23/2013 0923   VLDL 9 08/23/2013 0923   LDLCALC 110 (H) 08/23/2013 0923    Physical Exam:    VS:  BP 130/82   Pulse 78   Ht 5\' 8"  (1.727 m)   Wt 298 lb (135.2 kg)   SpO2 98%   BMI 45.31 kg/m     Wt Readings from Last 3 Encounters:  06/01/21 298 lb (135.2 kg)  04/12/21 293 lb 12.8 oz (133.3 kg)  03/23/21 297 lb 12.8 oz (135.1 kg)     GEN: Patient is in no acute distress HEENT: Normal NECK: No JVD; No carotid bruits LYMPHATICS: No lymphadenopathy CARDIAC: S1 S2 regular, 2/6 systolic murmur at the apex. RESPIRATORY:  Clear to auscultation without rales, wheezing or rhonchi  ABDOMEN: Soft, non-tender, non-distended MUSCULOSKELETAL:  No edema; No deformity  SKIN: Warm and dry NEUROLOGIC:  Alert and oriented x 3 PSYCHIATRIC:  Normal affect    Signed, 03/25/21, MD  06/01/2021 2:01 PM    [facility]

## 2021-06-08 ENCOUNTER — Telehealth: Payer: Self-pay

## 2021-06-08 LAB — LIPID PANEL
Chol/HDL Ratio: 3.8 ratio (ref 0.0–4.4)
Cholesterol, Total: 168 mg/dL (ref 100–199)
HDL: 44 mg/dL (ref >39)
LDL Chol Calc (NIH): 114 mg/dL — ABNORMAL HIGH (ref 0–99)
Triglycerides: 52 mg/dL (ref 0–149)
VLDL Cholesterol Cal: 10 mg/dL (ref 5–40)

## 2021-06-08 LAB — HEPATIC FUNCTION PANEL
ALT: 11 IU/L (ref 0–32)
AST: 13 IU/L (ref 0–40)
Albumin: 4.3 g/dL (ref 3.8–4.8)
Alkaline Phosphatase: 72 IU/L (ref 44–121)
Bilirubin Total: 0.3 mg/dL (ref 0.0–1.2)
Bilirubin, Direct: <0.1 mg/dL (ref 0.00–0.40)
Total Protein: 6.8 g/dL (ref 6.0–8.5)

## 2021-06-08 LAB — BASIC METABOLIC PANEL
BUN/Creatinine Ratio: 19 (ref 9–23)
BUN: 11 mg/dL (ref 6–20)
CO2: 21 mmol/L (ref 20–29)
Calcium: 9.4 mg/dL (ref 8.7–10.2)
Chloride: 104 mmol/L (ref 96–106)
Creatinine, Ser: 0.58 mg/dL (ref 0.57–1.00)
Glucose: 90 mg/dL (ref 65–99)
Potassium: 4.2 mmol/L (ref 3.5–5.2)
Sodium: 138 mmol/L (ref 134–144)
eGFR: 119 mL/min/{1.73_m2} (ref >59)

## 2021-06-08 NOTE — Telephone Encounter (Signed)
Spoke with patient regarding results and recommendation.  Patient verbalizes understanding and is agreeable to plan of care. Advised patient to call back with any issues or concerns.  

## 2021-06-08 NOTE — Telephone Encounter (Signed)
-----   Message from Garwin Brothers, MD sent at 06/08/2021 12:15 PM EDT ----- The results of the study is unremarkable. Please inform patient. I will discuss in detail at next appointment. Cc  primary care/referring physician Garwin Brothers, MD 06/08/2021 12:15 PM

## 2021-06-08 NOTE — Telephone Encounter (Signed)
-----   Message from Rajan R Revankar, MD sent at 06/08/2021 12:15 PM EDT ----- The results of the study is unremarkable. Please inform patient. I will discuss in detail at next appointment. Cc  primary care/referring physician Rajan R Revankar, MD 06/08/2021 12:15 PM  

## 2021-06-08 NOTE — Telephone Encounter (Signed)
Left message on patients voicemail to please return our call.   

## 2021-06-23 ENCOUNTER — Other Ambulatory Visit: Payer: Self-pay | Admitting: Internal Medicine

## 2021-06-23 DIAGNOSIS — E890 Postprocedural hypothyroidism: Secondary | ICD-10-CM

## 2021-07-25 ENCOUNTER — Telehealth (HOSPITAL_COMMUNITY): Payer: Self-pay

## 2021-07-25 NOTE — Telephone Encounter (Signed)
Detailed instructions left on the patient's answering machine. Asked to call back with any questions. S.Lizzie An EMTP 

## 2021-07-26 ENCOUNTER — Ambulatory Visit (HOSPITAL_COMMUNITY): Payer: 59 | Attending: Cardiology

## 2021-07-26 ENCOUNTER — Ambulatory Visit (HOSPITAL_COMMUNITY): Payer: 59 | Attending: Cardiovascular Disease

## 2021-07-26 ENCOUNTER — Other Ambulatory Visit: Payer: Self-pay

## 2021-07-26 DIAGNOSIS — R079 Chest pain, unspecified: Secondary | ICD-10-CM | POA: Insufficient documentation

## 2021-07-26 MED ORDER — PERFLUTREN LIPID MICROSPHERE
1.0000 mL | INTRAVENOUS | Status: AC | PRN
  Administered 2021-07-26 (×3): 1 mL via INTRAVENOUS

## 2021-07-27 LAB — ECHOCARDIOGRAM STRESS TEST
Area-P 1/2: 3.53 cm2
S' Lateral: 3.3 cm

## 2021-08-02 ENCOUNTER — Encounter: Payer: Self-pay | Admitting: Family

## 2021-09-19 ENCOUNTER — Other Ambulatory Visit: Payer: Self-pay

## 2021-09-19 ENCOUNTER — Encounter: Payer: Self-pay | Admitting: Cardiology

## 2021-09-19 ENCOUNTER — Ambulatory Visit (INDEPENDENT_AMBULATORY_CARE_PROVIDER_SITE_OTHER): Payer: 59 | Admitting: Cardiology

## 2021-09-19 VITALS — BP 140/84 | HR 77 | Ht 68.0 in | Wt 286.0 lb

## 2021-09-19 DIAGNOSIS — I10 Essential (primary) hypertension: Secondary | ICD-10-CM | POA: Diagnosis not present

## 2021-09-19 DIAGNOSIS — R0789 Other chest pain: Secondary | ICD-10-CM | POA: Diagnosis not present

## 2021-09-19 NOTE — Progress Notes (Signed)
Cardiology Office Note:    Date:  09/19/2021   ID:  Diamond [name], DOB 1986/10/23, MRN [id number]  PCP:  Sandford Craze, NP  Cardiologist:  Garwin Brothers, MD   Referring MD: Sandford Craze, NP    ASSESSMENT:    1. Primary hypertension   2. Morbid obesity (HCC)   3. Atypical chest pain    PLAN:    In order of problems listed above:  Primary prevention stressed to the patient.  Importance of compliance with diet medication stressed and she vocalized understanding.  She was advised to walk at least half an hour a day 5 days a week and she promises to do so. Essential hypertension: Blood pressure stable and diet was emphasized.  Lifestyle modification urged.  Salt intake issues were discussed. Obesity: Weight reduction was stressed risks of obesity emphasized and she is paying attention to her diet.  She is lost weight.  She is happy about it. Chest pain: Atypical in nature.  I discussed other evaluation modalities such as Lexiscan sestamibi in view of inconclusive stress testing.  She is not keen on it.  I explained risks and she understands.  Now she is walking without any symptoms and does not want to be tested anymore.  I respect her wishes. Patient will be seen in follow-up appointment in 9 months or earlier if the patient has any concerns    Medication Adjustments/Labs and Tests Ordered: Current medicines are reviewed at length with the patient today.  Concerns regarding medicines are outlined above.  No orders of the defined types were placed in this encounter.  No orders of the defined types were placed in this encounter.    Chief Complaint  Patient presents with   Follow-up     History of Present Illness:    Diamond [name] is a 35 y.o. female.  Patient has past medical history of atypical chest pain, essential hypertension and morbid obesity.  She denies any problems at this time and takes care of activities of daily living.  No chest pain  orthopnea or PND.  Her stress test was inconclusive.  She exercised for a fair amount of time on the treadmill and had significantly elevated blood pressure and the test was terminated.  Patient mentions to me that subsequent to this she started walking on a regular basis and walks to exercise at least half an hour a day on a regular basis.  No chest pain orthopnea or PND.  She feels much better at this time.  She is trying to lose weight also.  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   Iron deficiency anemia 09/19/2016   Morbid obesity (HCC) 06/01/2021   Multiple thyroid nodules 04/2013   3 nodules per pt   Pneumonia    hx of as child   Polyp of cervix 04/27/2021   Routine general medical examination at a health care facility 08/23/2013   S/P thyroidectomy 12/08/2017    Past Surgical History:  Procedure Laterality Date   APPENDECTOMY     "teenager"   FRACTURE SURGERY Right    "compound fracture of leg per pt"   HARDWARE REMOVAL     a month after fracture surgery   THYROIDECTOMY N/A 10/08/2013   Procedure: TOTAL THYROIDECTOMY;  Surgeon: Velora Heckler, MD;  Location: [facility];  Service:  General;  Laterality: N/A;   TONSILLECTOMY AND ADENOIDECTOMY  2012    Current Medications: Current Meds  Medication Sig   amLODipine (NORVASC) 5 MG tablet TAKE 1 TABLET (5 MG TOTAL) BY MOUTH DAILY.   ferrous sulfate 325 (65 FE) MG EC tablet Take 325 mg by mouth daily.   levothyroxine (SYNTHROID) 175 MCG tablet TAKE 1 TABLET BY MOUTH EVERY DAY BEFORE BREAKFAST     Allergies:   Penicillins   Social History   Socioeconomic History   Marital status: Single    Spouse name: Not on file   Number of children: Not on file   Years of education: Not on file   Highest education level: Not on file  Occupational History   Not on file  Tobacco Use    Smoking status: Never   Smokeless tobacco: Never  Vaping Use   Vaping Use: Never used  Substance and Sexual Activity   Alcohol use: Yes    Comment: occasional   Drug use: No   Sexual activity: Yes    Partners: Male    Birth control/protection: None  Other Topics Concern   Not on file  Social History Narrative   Grew up in [place]   Bachelors in Social Work   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   Single   No children   Lives with her family   Enjoys movies, meals with family, water parks with family.Family relocated together from [place]      Regular exercise: walk 2 times a wk around the track   Caffeine use: sometimes            Social Determinants of Health   Financial Resource Strain: Not on file  Food Insecurity: Not on file  Transportation Needs: Not on file  Physical Activity: Not on file  Stress: Not on file  Social Connections: Not on file     Family History: The patient's family history includes Cancer in her paternal grandmother; Diabetes in her father and paternal grandmother; Hyperlipidemia in her father and paternal grandmother; Hypertension in her father and paternal grandmother; Thyroid disease in her mother.  ROS:   Please see the history of present illness.    All other systems reviewed and are negative.  EKGs/Labs/Other Studies Reviewed:    The following studies were reviewed today: I discussed stress test report with the patient at length   Recent Labs: 03/23/2021: Hemoglobin 11.5; Platelets 356.0 04/12/2021: TSH 2.06 06/07/2021: ALT 11; BUN 11; Creatinine, Ser 0.58; Potassium 4.2; Sodium 138  Recent Lipid Panel    Component Value Date/Time   CHOL 168 06/07/2021 1016   TRIG 52 06/07/2021 1016   HDL 44 06/07/2021 1016   CHOLHDL 3.8 06/07/2021 1016   CHOLHDL 3.6 08/23/2013 0923   VLDL 9 08/23/2013 0923   LDLCALC 114 (H) 06/07/2021 1016    Physical Exam:    VS:  BP 140/84 (BP Location: Right Arm, Patient Position: Sitting, Cuff Size:  Large)   Pulse 77   Ht 5\' 8"  (1.727 m)   Wt 286 lb (129.7 kg)   SpO2 99%   BMI 43.49 kg/m     Wt Readings from Last 3 Encounters:  09/19/21 286 lb (129.7 kg)  06/01/21 298 lb (135.2 kg)  04/12/21 293 lb 12.8 oz (133.3 kg)     GEN: Patient is in no acute distress HEENT: Normal NECK: No JVD; No carotid bruits LYMPHATICS: No lymphadenopathy CARDIAC: Hear sounds regular, 2/6 systolic murmur at the apex.  RESPIRATORY:  Clear to auscultation without rales, wheezing or rhonchi  ABDOMEN: Soft, non-tender, non-distended MUSCULOSKELETAL:  No edema; No deformity  SKIN: Warm and dry NEUROLOGIC:  Alert and oriented x 3 PSYCHIATRIC:  Normal affect   Signed, Garwin Brothers, MD  09/19/2021 2:55 PM    [facility]

## 2021-09-19 NOTE — Patient Instructions (Signed)

## 2021-10-06 ENCOUNTER — Other Ambulatory Visit: Payer: Self-pay | Admitting: Family

## 2021-11-02 ENCOUNTER — Other Ambulatory Visit: Payer: Self-pay | Admitting: Family

## 2021-11-16 ENCOUNTER — Ambulatory Visit: Payer: 59 | Admitting: Cardiology

## 2021-11-19 ENCOUNTER — Other Ambulatory Visit: Payer: Self-pay

## 2021-11-19 MED ORDER — AMLODIPINE BESYLATE 5 MG PO TABS: 5.0000 mg | ORAL_TABLET | Freq: Every day | ORAL | 0 refills | Status: DC

## 2021-11-19 NOTE — Telephone Encounter (Signed)
Rx sent for 30 days. Needs OV with me please prior to additional refills.

## 2021-11-19 NOTE — Telephone Encounter (Signed)
Pt was last seen on 03/23/21 and has an upcoming appt with cards. Okay to refill meds or do you want to see her first?

## 2021-11-19 NOTE — Telephone Encounter (Signed)
Lvm for patient to call and schedule past due 6 months follow up

## 2021-11-22 NOTE — Telephone Encounter (Signed)
Patient has not call back to set up appointment, MyChart message sent

## 2022-01-04 ENCOUNTER — Encounter: Payer: Self-pay | Admitting: Cardiology

## 2022-01-04 ENCOUNTER — Encounter: Payer: Self-pay | Admitting: Family

## 2022-01-04 ENCOUNTER — Encounter: Payer: Self-pay | Admitting: Internal Medicine

## 2022-01-26 ENCOUNTER — Other Ambulatory Visit: Payer: Self-pay | Admitting: Family

## 2022-02-23 ENCOUNTER — Other Ambulatory Visit: Payer: Self-pay | Admitting: Family

## 2022-03-15 ENCOUNTER — Telehealth: Payer: Self-pay | Admitting: Family

## 2022-03-15 ENCOUNTER — Ambulatory Visit (INDEPENDENT_AMBULATORY_CARE_PROVIDER_SITE_OTHER): Payer: 59 | Admitting: Family

## 2022-03-15 ENCOUNTER — Encounter: Payer: Self-pay | Admitting: Family

## 2022-03-15 VITALS — BP 129/72 | HR 72 | Temp 97.8°F | Resp 16 | Ht 68.0 in | Wt 288.8 lb

## 2022-03-15 DIAGNOSIS — D509 Iron deficiency anemia, unspecified: Secondary | ICD-10-CM

## 2022-03-15 DIAGNOSIS — E890 Postprocedural hypothyroidism: Secondary | ICD-10-CM | POA: Diagnosis not present

## 2022-03-15 DIAGNOSIS — I10 Essential (primary) hypertension: Secondary | ICD-10-CM

## 2022-03-15 LAB — COMPREHENSIVE METABOLIC PANEL
ALT: 13 U/L (ref 0–35)
AST: 13 U/L (ref 0–37)
Albumin: 4.3 g/dL (ref 3.5–5.2)
Alkaline Phosphatase: 63 U/L (ref 39–117)
BUN: 8 mg/dL (ref 6–23)
CO2: 26 mEq/L (ref 19–32)
Calcium: 9.4 mg/dL (ref 8.4–10.5)
Chloride: 104 mEq/L (ref 96–112)
Creatinine, Ser: 0.55 mg/dL (ref 0.40–1.20)
GFR: 116.75 mL/min (ref >60.00)
Glucose, Bld: 96 mg/dL (ref 70–99)
Potassium: 3.8 mEq/L (ref 3.5–5.1)
Sodium: 139 mEq/L (ref 135–145)
Total Bilirubin: 0.4 mg/dL (ref 0.2–1.2)
Total Protein: 6.8 g/dL (ref 6.0–8.3)

## 2022-03-15 LAB — CBC WITH DIFFERENTIAL/PLATELET
Basophils Absolute: 0 10*3/uL (ref 0.0–0.1)
Basophils Relative: 0.2 % (ref 0.0–3.0)
Eosinophils Absolute: 0.1 10*3/uL (ref 0.0–0.7)
Eosinophils Relative: 2.2 % (ref 0.0–5.0)
HCT: 36.4 % (ref 36.0–46.0)
Hemoglobin: 12 g/dL (ref 12.0–15.0)
Lymphocytes Relative: 43.8 % (ref 12.0–46.0)
Lymphs Abs: 2.3 10*3/uL (ref 0.7–4.0)
MCHC: 33 g/dL (ref 30.0–36.0)
MCV: 84.6 fl (ref 78.0–100.0)
Monocytes Absolute: 0.4 10*3/uL (ref 0.1–1.0)
Monocytes Relative: 7.8 % (ref 3.0–12.0)
Neutro Abs: 2.4 10*3/uL (ref 1.4–7.7)
Neutrophils Relative %: 46 % (ref 43.0–77.0)
Platelets: 299 10*3/uL (ref 150.0–400.0)
RBC: 4.3 Mil/uL (ref 3.87–5.11)
RDW: 15.2 % (ref 11.5–15.5)
WBC: 5.3 10*3/uL (ref 4.0–10.5)

## 2022-03-15 LAB — FERRITIN: Ferritin: 17.8 ng/mL (ref 10.0–291.0)

## 2022-03-15 LAB — TSH: TSH: 0.57 u[IU]/mL (ref 0.35–5.50)

## 2022-03-15 LAB — IRON: Iron: 40 ug/dL — ABNORMAL LOW (ref 42–145)

## 2022-03-15 MED ORDER — AMLODIPINE BESYLATE 5 MG PO TABS: 5.0000 mg | ORAL_TABLET | Freq: Every day | ORAL | 1 refills | Status: DC

## 2022-03-15 MED ORDER — LEVOTHYROXINE SODIUM 175 MCG PO TABS: 175.0000 ug | ORAL_TABLET | Freq: Every day | ORAL | 1 refills | Status: DC

## 2022-03-15 NOTE — Progress Notes (Signed)
? ?Subjective:  ? ?By signing my name below, I, Diamond [name], attest that this documentation has been prepared under the direction and in the presence of [place], NP 03/15/2022   ? ? Patient ID: Diamond [name], female    DOB: 01/04/1986, 36 y.o.   MRN: [id number] ? ?Chief Complaint  ?Patient presents with  ? Hypertension  ?  Here for follow up  ? Hypothyroidism  ?  Here for follow up  ? ? ?HPI ?Patient is in today for an office visit. ? ?Blood Pressure - As of today's visit, her blood pressure is good. She is currently taking 5 MG of Amlodipine. She denies of any SOB, chest pain, or leg swelling. She been exercising and reports that she doesn't experience heart palpitations.  ? ?BP Readings from Last 3 Encounters:  ?03/15/22 129/72  ?09/19/21 140/84  ?06/01/21 130/82  ? ?Pulse Readings from Last 3 Encounters:  ?03/15/22 72  ?09/19/21 77  ?06/01/21 78  ? ?Thyroid - She is content with taking 175MCG of Synthroid.  ?Lab Results  ?Component Value Date  ? TSH 2.06 04/12/2021  ? ?Pap Smear - She is seeing a gynecologist for her pap smears. She is due for a pap smear in 04/2022 ? ?Iron - She is currently taking an iron supplement. She occasionally has heavy periods.  ? ?Health Maintenance Due  ?Topic Date Due  ? PAP SMEAR-Modifier  08/23/2016  ? ? ?Past Medical History:  ?Diagnosis Date  ? Anemia   ? Atypical chest pain 03/23/2021  ? Cervical lesion 08/23/2013  ? History of chicken pox   ? History of chicken pox 05/30/2021  ? Hypertension 03/23/2021  ? 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 Iron deficiency anemia 09/19/2016  ? Morbid obesity ([place]) 06/01/2021  ? Multiple thyroid nodules 04/2013  ? 3 nodules per pt  ? Pneumonia   ? hx of as child  ? Polyp of cervix 04/27/2021  ? Routine general medical examination at a health care facility 08/23/2013  ? S/P thyroidectomy 12/08/2017  ? S/P thyroidectomy 12/08/2017  ? ? ?Past Surgical History:  ?Procedure Laterality Date  ? APPENDECTOMY    ? "teenager"  ? FRACTURE  SURGERY Right   ? "compound fracture of leg per pt"  ? HARDWARE REMOVAL    ? a month after fracture surgery  ? THYROIDECTOMY N/A 10/08/2013  ? Procedure: TOTAL THYROIDECTOMY;  Surgeon: Earnstine Regal, MD;  Location: [facility];  Service: General;  Laterality: N/A;  ? TONSILLECTOMY AND ADENOIDECTOMY  2012  ? ? ?Family History  ?Problem Relation Age of Onset  ? Thyroid disease Mother   ?     " had thyroid removed due to nodules per pt"  ? Hyperlipidemia Father   ? Hypertension Father   ? Diabetes Father   ? Cancer Paternal Grandmother   ?     lung  ? Hyperlipidemia Paternal Grandmother   ? Hypertension Paternal Grandmother   ? Diabetes Paternal Grandmother   ? ? ?Social History  ? ?Socioeconomic History  ? Marital status: Single  ?  Spouse name: Not on file  ? Number of children: Not on file  ? Years of education: Not on file  ? Highest education level: Not on file  ?Occupational History  ? Not on file  ?Tobacco Use  ? Smoking status: Never  ? Smokeless tobacco: Never  ?Vaping Use  ? Vaping Use: Never used  ?Substance and Sexual Activity  ? Alcohol use: Yes  ?  Comment:  occasional  ? Drug use: No  ? Sexual activity: Yes  ?  Partners: Male  ?  Birth control/protection: None  ?Other Topics Concern  ? Not on file  ?Social History Narrative  ? Grew up in [place]  ? Bachelors in Social Work  ? 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 Single  ? No children  ? Lives with her family  ? Enjoys movies, meals with family, water parks with family.Family relocated together from [place]  ?   ? Regular exercise: walk 2 times a wk around the track  ? Caffeine use: sometimes  ?   ?   ?   ? ?Social Determinants of Health  ? ?Financial Resource Strain: Not on file  ?Food Insecurity: Not on file  ?Transportation Needs: Not on file  ?Physical Activity: Not on file  ?Stress: Not on file  ?Social Connections: Not on file  ?Intimate Partner Violence: Not on file  ? ? ?Outpatient Medications Prior to Visit  ?Medication Sig Dispense Refill  ? ferrous  sulfate 325 (65 FE) MG EC tablet Take 325 mg by mouth daily.    ? amLODipine (NORVASC) 5 MG tablet TAKE 1 TABLET (5 MG TOTAL) BY MOUTH DAILY. 30 tablet 0  ? levothyroxine (SYNTHROID) 175 MCG tablet TAKE 1 TABLET BY MOUTH EVERY DAY BEFORE BREAKFAST 90 tablet 1  ? ?No facility-administered medications prior to visit.  ? ? ?Allergies  ?Allergen Reactions  ? Penicillins Rash  ?  Has patient had a PCN reaction causing immediate rash, facial/tongue/throat swelling, SOB or lightheadedness with hypotension: Y ?Has patient had a PCN reaction causing severe rash involving mucus membranes or skin necrosis: Y ?Has patient had a PCN reaction that required hospitalization: N ?Has patient had a PCN reaction occurring within the last 10 years: Y ?If all of the above answers are "NO", then may proceed with Cephalosporin use. ?  ? ? ?Review of Systems  ?Respiratory:  Negative for shortness of breath.   ?Cardiovascular:  Negative for chest pain, palpitations and leg swelling.  ? ?   ?Objective:  ?  ?Physical Exam ?Constitutional:   ?   General: She is not in acute distress. ?   Appearance: Normal appearance. She is not ill-appearing.  ?HENT:  ?   Head: Normocephalic and atraumatic.  ?   Right Ear: External ear normal.  ?   Left Ear: External ear normal.  ?Eyes:  ?   Extraocular Movements: Extraocular movements intact.  ?   Pupils: Pupils are equal, round, and reactive to light.  ?Cardiovascular:  ?   Rate and Rhythm: Normal rate and regular rhythm.  ?   Heart sounds: Normal heart sounds. No murmur heard. ?  No gallop.  ?Pulmonary:  ?   Effort: Pulmonary effort is normal. No respiratory distress.  ?   Breath sounds: Normal breath sounds. No wheezing or rales.  ?Skin: ?   General: Skin is warm and dry.  ?Neurological:  ?   Mental Status: She is alert and oriented to person, place, and time.  ?Psychiatric:     ?   Mood and Affect: Mood normal.     ?   Behavior: Behavior normal.     ?   Judgment: Judgment normal.  ? ? ?BP 129/72 (BP  Location: Right Arm, Patient Position: Sitting, Cuff Size: Large)   Pulse 72   Temp 97.8 ?F (36.6 ?C) (Oral)   Resp 16   Ht 5' 8"  (1.727 m)  Wt 288 lb 12.8 oz (131 kg)   SpO2 98%   BMI 43.91 kg/m?  ?Wt Readings from Last 3 Encounters:  ?03/15/22 288 lb 12.8 oz (131 kg)  ?09/19/21 286 lb (129.7 kg)  ?06/01/21 298 lb (135.2 kg)  ? ? ?   ?Assessment & Plan:  ? ?Problem List Items Addressed This Visit   ? ?  ? Unprioritized  ? Iron deficiency anemia - Primary (Chronic)  ?  Continues iron supplement. Will obtain follow up iron studies/cbc. ? ?  ?  ? Relevant Orders  ? CBC with Differential/Platelet  ? Iron  ? Ferritin  ? Hypothyroidism, postsurgical  ?  Lab Results  ?Component Value Date  ? TSH 2.06 04/12/2021  ?Continues synthroid 175 mcg once daily. Clinically stable on this dose. Will obtain follow up TSH.  ?  ?  ? Relevant Medications  ? levothyroxine (SYNTHROID) 175 MCG tablet  ? Other Relevant Orders  ? TSH  ? Hypertension  ?  BP Readings from Last 3 Encounters:  ?03/15/22 129/72  ?09/19/21 140/84  ?06/01/21 130/82  ?At goal on amlodipine 37m.  Continue same. Obtain follow up CMET.  ? ?  ?  ? Relevant Medications  ? amLODipine (NORVASC) 5 MG tablet  ? Other Relevant Orders  ? Comp Met (CMET)  ? ? ? ? ?Meds ordered this encounter  ?Medications  ? levothyroxine (SYNTHROID) 175 MCG tablet  ?  Sig: Take 1 tablet (175 mcg total) by mouth daily before breakfast.  ?  Dispense:  90 tablet  ?  Refill:  1  ?  Order Specific Question:   Supervising Provider  ?  Answer:   BPenni HomansA [[[id number]] ? amLODipine (NORVASC) 5 MG tablet  ?  Sig: Take 1 tablet (5 mg total) by mouth daily.  ?  Dispense:  90 tablet  ?  Refill:  1  ?  Order Specific Question:   Supervising Provider  ?  Answer:   BPenni HomansA [[[id number]] ? ? ?I, MNance Pear NP, personally preformed the services described in this documentation.  All medical record entries made by the scribe were at my direction and in my presence.  I have reviewed the  chart and discharge instructions (if applicable) and agree that the record reflects my personal performance and is accurate and complete. 03/15/2022 ? ? ?I,Amber Collins,acting as a scribe for MNance Pear NP.

## 2022-03-15 NOTE — Telephone Encounter (Signed)
Please call and request Pap smear from [facility] [phone] ?

## 2022-03-15 NOTE — Assessment & Plan Note (Addendum)
BP Readings from Last 3 Encounters:  ?03/15/22 129/72  ?09/19/21 140/84  ?06/01/21 130/82  ? ?At goal on amlodipine 5mg .  Continue same. Obtain follow up CMET.  ? ?

## 2022-03-15 NOTE — Assessment & Plan Note (Signed)
Continues iron supplement. Will obtain follow up iron studies/cbc. ?

## 2022-03-15 NOTE — Telephone Encounter (Signed)
Records release form faxed to [facility] ?

## 2022-03-15 NOTE — Assessment & Plan Note (Addendum)
Lab Results  ?Component Value Date  ? TSH 2.06 04/12/2021  ? ?Continues synthroid 175 mcg once daily. Clinically stable on this dose. Will obtain follow up TSH.  ?

## 2022-04-12 ENCOUNTER — Ambulatory Visit: Payer: 59 | Admitting: Internal Medicine

## 2022-06-20 ENCOUNTER — Encounter (HOSPITAL_COMMUNITY): Payer: Self-pay

## 2022-06-20 ENCOUNTER — Emergency Department (HOSPITAL_COMMUNITY)
Admission: EM | Admit: 2022-06-20 | Discharge: 2022-06-20 | Disposition: A | Discharge status: Home / Self Care | Payer: 59 | Attending: Emergency Medicine | Admitting: Emergency Medicine

## 2022-06-20 DIAGNOSIS — L0291 Cutaneous abscess, unspecified: Secondary | ICD-10-CM

## 2022-06-20 DIAGNOSIS — Z79899 Other long term (current) drug therapy: Secondary | ICD-10-CM | POA: Insufficient documentation

## 2022-06-20 DIAGNOSIS — L02411 Cutaneous abscess of right axilla: Secondary | ICD-10-CM | POA: Diagnosis present

## 2022-06-20 DIAGNOSIS — I10 Essential (primary) hypertension: Secondary | ICD-10-CM | POA: Insufficient documentation

## 2022-06-20 MED ORDER — LIDOCAINE HCL (PF) 1 % IJ SOLN
30.0000 mL | Freq: Once | INTRAMUSCULAR | Status: AC
  Administered 2022-06-20: 30 mL
  Filled 2022-06-20: qty 30

## 2022-06-20 MED ORDER — DOXYCYCLINE HYCLATE 100 MG PO CAPS: 100.0000 mg | ORAL_CAPSULE | Freq: Two times a day (BID) | ORAL | 0 refills | Status: DC

## 2022-06-20 NOTE — Discharge Instructions (Signed)
Clean area twice a day with soap and water.  Return if any problems

## 2022-06-20 NOTE — ED Triage Notes (Signed)
Pt reports abscess in her right arm pit for the past 4 days. States the pain and swelling has worsened. Denies fever or drainage.

## 2022-06-20 NOTE — ED Provider Notes (Signed)
[facility]-EMERGENCY DEPT Provider Note   CSN: [id number] Arrival date & time: 06/20/22  0845     History  Chief Complaint  Patient presents with   Abscess    Diamond [name] is a 36 y.o. female.  Patient has history of hypertension.  Patient complains of swelling underneath her right axilla.  The history is provided by the patient and medical records. No language interpreter was used.  Abscess Abscess location: Right axilla. Abscess quality: not draining   Red streaking: no   Progression:  Worsening Chronicity:  New Context: not diabetes   Relieved by:  Nothing Worsened by:  Nothing Ineffective treatments:  None tried Associated symptoms: no fatigue and no headaches        Home Medications Prior to Admission medications   Medication Sig Start Date End Date Taking? Authorizing Provider  doxycycline (VIBRAMYCIN) 100 MG capsule Take 1 capsule (100 mg total) by mouth 2 (two) times daily. One po bid x 7 days 06/20/22  Yes Bethann Berkshire, MD  amLODipine (NORVASC) 5 MG tablet Take 1 tablet (5 mg total) by mouth daily. 03/15/22   Sandford Craze, NP  ferrous sulfate 325 (65 FE) MG EC tablet Take 325 mg by mouth daily.    [provider]  levothyroxine (SYNTHROID) 175 MCG tablet Take 1 tablet (175 mcg total) by mouth daily before breakfast. 03/15/22   Sandford Craze, NP      Allergies    Penicillins    Review of Systems   Review of Systems  Constitutional:  Negative for appetite change and fatigue.  HENT:  Negative for congestion, ear discharge and sinus pressure.   Eyes:  Negative for discharge.  Respiratory:  Negative for cough.   Cardiovascular:  Negative for chest pain.  Gastrointestinal:  Negative for abdominal pain and diarrhea.  Genitourinary:  Negative for frequency and hematuria.  Musculoskeletal:  Negative for back pain.  Skin:  Positive for rash.  Neurological:  Negative for seizures and headaches.   Psychiatric/Behavioral:  Negative for hallucinations.     Physical Exam Updated Vital Signs BP (!) 149/88 (BP Location: Left Arm)   Pulse 79   Temp 98.1 F (36.7 C) (Oral)   Resp 18   Ht 5\' 8"  (1.727 m)   Wt 136.1 kg   SpO2 100%   BMI 45.61 kg/m  Physical Exam Vitals and nursing note reviewed.  Constitutional:      Appearance: She is well-developed.  HENT:     Head: Normocephalic.     Nose: Nose normal.  Eyes:     General: No scleral icterus.    Conjunctiva/sclera: Conjunctivae normal.  Neck:     Thyroid: No thyromegaly.  Cardiovascular:     Rate and Rhythm: Normal rate and regular rhythm.     Heart sounds: No murmur heard.    No friction rub. No gallop.  Pulmonary:     Breath sounds: No stridor. No wheezing or rales.  Chest:     Chest wall: No tenderness.  Abdominal:     General: There is no distension.     Tenderness: There is no abdominal tenderness. There is no rebound.  Musculoskeletal:        General: Normal range of motion.     Cervical back: Neck supple.  Lymphadenopathy:     Cervical: No cervical adenopathy.  Skin:    Findings: Rash present. No erythema.     Comments: Abscess right axilla  Neurological:     Mental Status: She is  alert and oriented to person, place, and time.     Motor: No abnormal muscle tone.     Coordination: Coordination normal.  Psychiatric:        Behavior: Behavior normal.     ED Results / Procedures / Treatments   Labs (all labs ordered are listed, but only abnormal results are displayed) Labs Reviewed - No data to display  EKG None  Radiology No results found.  Procedures .Marland KitchenIncision and Drainage  Date/Time: 06/22/2022 11:08 AM  Performed by: Bethann Berkshire, MD Authorized by: Bethann Berkshire, MD   Comments:     Patient had a moderate-sized abscess to right axilla area.  Area was cleaned thoroughly with Betadine.  Lidocaine without epi was used to anesthetize the area.  A #11 blade was used to open the abscess  and a moderate amount of pus was removed.  The patient tolerated the procedure well     Medications Ordered in ED Medications  lidocaine (PF) (XYLOCAINE) 1 % injection 30 mL (has no administration in time range)    ED Course/ Medical Decision Making/ A&P                           Medical Decision Making Risk Prescription drug management.  This patient presents to the ED for concern of pain in her right axilla, this involves an extensive number of treatment options, and is a complaint that carries with it a high risk of complications and morbidity.  The differential diagnosis includes abscess   Co morbidities that complicate the patient evaluation  Hypertension   Additional history obtained:  Additional history obtained from patient External records from outside source obtained and reviewed including hospital records   Lab Tests:  No labs  Imaging Studies ordered:  No imaging  Cardiac Monitoring: / EKG:  The patient was maintained on a cardiac monitor.  I personally viewed and interpreted the cardiac monitored which showed an underlying rhythm of: Normal sinus rhythm   Consultations Obtained:  No consultant Problem List / ED Course / Critical interventions / Medication management  Hypertension and right axilla abscess no medicines given reevaluation of the patient after these medicines showed that the patient stayed the same I have reviewed the patients home medicines and have made adjustments as needed   Social Determinants of Health:  None   Test / Admission - Considered:  None  Patient with abscess on the right axilla.  It was incised with a #11 blade and a small amount of pus was removed.  Area was cleaned thoroughly and patient was discharged on doxycycline and will follow-up if needed        Final Clinical Impression(s) / ED Diagnoses Final diagnoses:  Abscess    Rx / DC Orders ED Discharge Orders          Ordered    doxycycline  (VIBRAMYCIN) 100 MG capsule  2 times daily        06/20/22 3354              Bethann Berkshire, MD 06/22/22 1110

## 2022-08-09 ENCOUNTER — Encounter: Payer: Self-pay | Admitting: Internal Medicine

## 2022-08-09 ENCOUNTER — Ambulatory Visit (INDEPENDENT_AMBULATORY_CARE_PROVIDER_SITE_OTHER): Payer: 59 | Admitting: Internal Medicine

## 2022-08-09 VITALS — BP 140/88 | HR 88 | Ht 68.0 in | Wt 298.8 lb

## 2022-08-09 DIAGNOSIS — E890 Postprocedural hypothyroidism: Secondary | ICD-10-CM | POA: Diagnosis not present

## 2022-08-09 LAB — T4, FREE: Free T4: 1.23 ng/dL (ref 0.60–1.60)

## 2022-08-09 LAB — TSH: TSH: 1.51 u[IU]/mL (ref 0.35–5.50)

## 2022-08-09 MED ORDER — LEVOTHYROXINE SODIUM 175 MCG PO TABS: 175.0000 ug | ORAL_TABLET | Freq: Every day | ORAL | 3 refills | Status: DC

## 2022-08-09 NOTE — Patient Instructions (Addendum)
Please stop at the lab.  Please continue Levothyroxine 175 mcg daily.  Take the thyroid hormone every day, with water, >30 minutes before breakfast, separated by >4 hours from acid reflux medications, calcium, iron, multivitamins.  Please return in 1 year.

## 2022-08-09 NOTE — Progress Notes (Signed)
Patient ID: Diamond [name], female   DOB: [birth year], 36 y.o.   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 Last visit 1 year and 4 months ago.  Interim history: At last visit, she lost a significant amount of weight after decreasing fast food and the intake of sweet drinks: 356 lbs 04/2021 >> 293 lbs.  She gained 7 pounds since then. She feels much better after losing weight.  Reviewed history: Patient was diagnosed with enlarged thyroid approximately 1 year ago - at [facility]. She had a thyroid ultrasound that showed 3 thyroid nodules. The 2 largest nodules were biopsied >> benign (10/2012).     She was describing a "lump" mid neck - for 2 years, + hoarseness for 2 years, + dysphagia - meats but also swallowing saliva laying down/no odynophagia, + SOB with lying down.  A Barium swallow showed mild impression from thyroid on Esophagus >> I referred her to surgery >> had total thyroidectomy 10/08/2013 >> final pathology shows nodular hyperplasia. Postoperative serum calcium level was normal at 9.6. She was started on Synthroid 100 mcg daily.  Dose was changed afterwards.  Last visit with Dr Harlow Asa was on 12/13/2013.  She is on levothyroxine 175 mcg (last dose increase 01/2019) daily, taken: - in am - fasting - at least 30 min from b'fast - no Ca - + MVI in the pm - no PPIs - + 2x Fe every day, at night - no Biotin anymore  Reviewed patient's TFTs: Lab Results  Component Value Date   TSH 0.57 03/15/2022   TSH 2.06 04/12/2021   TSH 1.53 09/08/2020   TSH 3.31 12/09/2019   TSH 4.32 04/01/2019   TSH 8.20 (H) 01/11/2019   TSH 8.24 (H) 12/08/2018   TSH 0.90 12/08/2017   TSH 2.41 09/18/2016   TSH 2.58 01/11/2016   FREET4 1.45 04/12/2021   FREET4 1.24 12/09/2019   FREET4 1.19 04/01/2019   FREET4 1.08 01/11/2019   FREET4 1.21 12/08/2018   FREET4 1.71 (H) 12/08/2017    FREET4 1.21 09/18/2016   FREET4 1.23 01/11/2016   FREET4 0.98 09/19/2015   FREET4 1.16 07/28/2015   Pt denies: - feeling nodules in neck - dysphagia - choking She continues to have hoarseness, which was present before the surgery.  Pt also has a history of  iron deficiency anemia.  She works in the post [employer].   ROS: + see HPI  I reviewed pt's medications, allergies, PMH, social hx, family hx, and changes were documented in the history of present illness. Otherwise, unchanged from my initial visit note.  Past Medical History:  Diagnosis Date   Anemia    Atypical chest pain 03/23/2021   Cervical lesion 08/23/2013   History of chicken pox    History of chicken pox 05/30/2021   Hypertension 03/23/2021   Hypothyroidism, postsurgical 12/13/2013   Iron deficiency anemia 09/19/2016   Morbid obesity ([place]) 06/01/2021   Multiple thyroid nodules 04/2013   3 nodules per pt   Pneumonia    hx of as child   Polyp of cervix 04/27/2021   Routine general medical examination at a health care facility 08/23/2013   S/P thyroidectomy 12/08/2017   S/P thyroidectomy 12/08/2017   Past Surgical History:  Procedure Laterality Date   APPENDECTOMY     "teenager"   FRACTURE SURGERY Right    "compound fracture of leg per pt"   HARDWARE REMOVAL  a month after fracture surgery   THYROIDECTOMY N/A 10/08/2013   Procedure: TOTAL THYROIDECTOMY;  Surgeon: Velora Heckler, MD;  Location: [facility];  Service: General;  Laterality: N/A;   TONSILLECTOMY AND ADENOIDECTOMY  2012   Social History   Socioeconomic History   Marital status: Single    Spouse name: Not on file   Number of children: Not on file   Years of education: Not on file   Highest education level: Not on file  Occupational History   Not on file  Tobacco Use   Smoking status: Never   Smokeless tobacco: Never  Vaping Use   Vaping Use: Never used  Substance and Sexual Activity   Alcohol use: Yes    Comment: occasional   Drug use: No    Sexual activity: Yes    Partners: Male    Birth control/protection: None  Other Topics Concern   Not on file  Social History Narrative   Grew up in [place]   Bachelors in Social Work   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   Single   No children   Lives with her family   Enjoys movies, meals with family, water parks with family.Family relocated together from [place]      Regular exercise: walk 2 times a wk around the track   Caffeine use: sometimes            Social Determinants of Health   Financial Resource Strain: Not on file  Food Insecurity: Not on file  Transportation Needs: Not on file  Physical Activity: Not on file  Stress: Not on file  Social Connections: Not on file  Intimate Partner Violence: Not on file   Current Outpatient Medications on File Prior to Visit  Medication Sig Dispense Refill   amLODipine (NORVASC) 5 MG tablet Take 1 tablet (5 mg total) by mouth daily. 90 tablet 1   doxycycline (VIBRAMYCIN) 100 MG capsule Take 1 capsule (100 mg total) by mouth 2 (two) times daily. One po bid x 7 days 14 capsule 0   ferrous sulfate 325 (65 FE) MG EC tablet Take 325 mg by mouth daily.     levothyroxine (SYNTHROID) 175 MCG tablet Take 1 tablet (175 mcg total) by mouth daily before breakfast. 90 tablet 1   No current facility-administered medications on file prior to visit.   Allergies  Allergen Reactions   Penicillins Rash    Has patient had a PCN reaction causing immediate rash, facial/tongue/throat swelling, SOB or lightheadedness with hypotension: Y Has patient had a PCN reaction causing severe rash involving mucus membranes or skin necrosis: Y Has patient had a PCN reaction that required hospitalization: N Has patient had a PCN reaction occurring within the last 10 years: Y If all of the above answers are "NO", then may proceed with Cephalosporin use.    Family History  Problem Relation Age of Onset   Thyroid disease Mother        " had thyroid removed due  to nodules per pt"   Hyperlipidemia Father    Hypertension Father    Diabetes Father    Cancer Paternal Grandmother        lung   Hyperlipidemia Paternal Grandmother    Hypertension Paternal Grandmother    Diabetes Paternal Grandmother     PE: BP (!) 140/88 (BP Location: Right Arm, Patient Position: Sitting, Cuff Size: Normal)   Pulse 88   Ht 5\' 8"  (1.727 m)   Wt 298 lb  12.8 oz (135.5 kg)   SpO2 98%   BMI 45.43 kg/m    Wt Readings from Last 3 Encounters:  08/09/22 298 lb 12.8 oz (135.5 kg)  06/20/22 300 lb (136.1 kg)  03/15/22 288 lb 12.8 oz (131 kg)   Constitutional: overweight, in NAD Eyes:  EOMI, no exophthalmos ENT: no neck masses, no cervical lymphadenopathy Cardiovascular: RRR, No MRG Respiratory: CTA B Musculoskeletal: no deformities Skin:no rashes Neurological: no tremor with outstretched hands  ASSESSMENT: 1.  Postsurgical hypothyroidism  - thyroid ultrasound at [facility] 09/09/2012: Right lobe is 7.3 x 2.5 x 2.2 cm. On the right, there is a solid 2.2 x 1.2 x 1.6 cm mass. The second mass measures 1.0 x 1.2 x 1.2 cm. A third mass measures 1.6 x 1.1 x 1.3 cm. There are several subcentimeter masses on the right as well. Left lobe is 8.0 x 3.8 x 2.8 cm There is a mass in the left lobe measuring 5.0 x 4.9 x 3.9 cm, with few tiny calcifications. Isthmus is 8 mm in thickness.  In the isthmus, there is a mass measuring 1.8 x 1.7 x 1.4 cm all masses are solid. Enlarged thyroid with multiple solid masses.  - thyroid FNA (10/2012):  left sided 4 cm nodule: benign, with Hurthle cell and cystic changes right-sided 2 cm nodule: benign, with Hurthle cell and cystic changes  - Ba swallow 08/27/2013: Slight impression on the anterior lower cervical esophagus, possibly related to the patient's known multinodular goiter. Brief sticking of the 13 mm barium tablet in the cervical esophagus.  - total thyroidectomy 10/08/2013 >> benign pathology  Plan: 1.   Postsurgical hypothyroidism -Patient with history of total thyroidectomy in 10/2013 due to progressing multinodular goiter with dysphagia and hoarseness and also barium swallow test showing thyroid compression of the esophagus.  Final pathology was benign. - latest thyroid labs reviewed with pt. >> normal: Lab Results  Component Value Date   TSH 0.57 03/15/2022  - she continues on LT4 175 mcg daily - pt feels good on this dose.  She gained 7-8 pounds since last visit. - we discussed about taking the thyroid hormone every day, with water, >30 minutes before breakfast, separated by >4 hours from acid reflux medications, calcium, iron, multivitamins. Pt. is taking it correctly. - will check thyroid tests today: TSH and fT4 - If labs are abnormal, she will need to return for repeat TFTs in 1.5 months  Needs refills.  Component     Latest Ref Rng 08/09/2022  TSH     0.35 - 5.50 uIU/mL 1.51   T4,Free(Direct)     0.60 - 1.60 ng/dL 6.60    Excellent TFTs.  We will refill the current levothyroxine dose.  Carlus Pavlov, MD PhD [facility]

## 2022-10-07 ENCOUNTER — Telehealth: Payer: Self-pay | Admitting: Family

## 2022-10-07 NOTE — Telephone Encounter (Signed)
Please contact pt to schedule follow up visit.

## 2022-10-09 NOTE — Telephone Encounter (Signed)
Lvm2 sched  

## 2022-10-18 ENCOUNTER — Ambulatory Visit: Payer: 59 | Attending: Cardiology | Admitting: Cardiology

## 2022-10-18 ENCOUNTER — Encounter: Payer: Self-pay | Admitting: Cardiology

## 2022-10-18 VITALS — BP 142/82 | HR 76 | Ht 68.0 in | Wt 309.0 lb

## 2022-10-18 DIAGNOSIS — I10 Essential (primary) hypertension: Secondary | ICD-10-CM | POA: Diagnosis not present

## 2022-10-18 NOTE — Progress Notes (Signed)
Cardiology Office Note:    Date:  10/18/2022   ID:  Diamond [name], DOB Sep 15, 1986, MRN [id number]  PCP:  Sandford Craze, NP  Cardiologist:  Garwin Brothers, MD   Referring MD: Sandford Craze, NP    ASSESSMENT:    1. Primary hypertension   2. Morbid obesity (HCC)    PLAN:    In order of problems listed above:  Primary prevention stressed with the patient.  Importance of compliance with diet medication stressed and she vocalized understanding.  She is advised to walk at least 30 minutes a day 5 days a week and she promises to do so. Essential hypertension: Blood pressure is stable and diet was emphasized.  Lifestyle modification urged.   Morbid obesity: Weight reduction stressed and she promises to do better.  Risks of obesity explained.  Lipids were reviewed and discussed with her at length. She will be seen in follow-up appointment on an annual basis or earlier if she has any concerns.   Medication Adjustments/Labs and Tests Ordered: Current medicines are reviewed at length with the patient today.  Concerns regarding medicines are outlined above.  No orders of the defined types were placed in this encounter.  No orders of the defined types were placed in this encounter.    No chief complaint on file.    History of Present Illness:    Diamond [name] is a 36 y.o. female.  Patient has past medical history of essential hypertension and morbid obesity.  She denies any problems at this time and takes care of activities of daily living.  She mentions to me that she has not taken the best care of herself as she is spending a lot of time with her dad who has been diagnosed with cancer.  She is eating significantly outside and that is a problem and has gained weight.  At the time of my evaluation, the patient is alert awake oriented and in no distress.  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    Iron deficiency anemia 09/19/2016   Morbid obesity (HCC) 06/01/2021   Multiple thyroid nodules 04/2013   3 nodules per pt   Polyp of cervix 04/27/2021   Routine general medical examination at a health care facility 08/23/2013    Past Surgical History:  Procedure Laterality Date   APPENDECTOMY     "teenager"   FRACTURE SURGERY Right    "compound fracture of leg per pt"   HARDWARE REMOVAL     a month after fracture surgery   THYROIDECTOMY N/A 10/08/2013   Procedure: TOTAL THYROIDECTOMY;  Surgeon: Velora Heckler, MD;  Location: [facility];  Service: General;  Laterality: N/A;   TONSILLECTOMY AND ADENOIDECTOMY  2012    Current Medications: Current Meds  Medication Sig   amLODipine (NORVASC) 5 MG tablet TAKE 1 TABLET (5 MG TOTAL) BY MOUTH DAILY.   ferrous sulfate 325 (65 FE) MG EC tablet Take 325 mg by mouth daily.   levothyroxine (SYNTHROID) 175 MCG tablet Take 1 tablet (175 mcg total) by mouth daily before breakfast.     Allergies:   Penicillins   Social History   Socioeconomic History   Marital status: Single    Spouse name: Not on file   Number of children: Not on file   Years of education: Not on file   Highest education level: Not on file  Occupational History   Not on file  Tobacco Use   Smoking status: Never  Smokeless tobacco: Never  Vaping Use   Vaping Use: Never used  Substance and Sexual Activity   Alcohol use: Yes    Comment: occasional   Drug use: No   Sexual activity: Yes    Partners: Male    Birth control/protection: None  Other Topics Concern   Not on file  Social History Narrative   Grew up in [place]   Bachelors in Social Work   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   Single   No children   Lives with her family   Enjoys movies, meals with family, water parks with family.Family relocated together from [place]      Regular exercise: walk 2 times a wk around the track   Caffeine use: sometimes            Social Determinants of Health    Financial Resource Strain: Not on file  Food Insecurity: Not on file  Transportation Needs: Not on file  Physical Activity: Not on file  Stress: Not on file  Social Connections: Not on file     Family History: The patient's family history includes Cancer in her paternal grandmother; Diabetes in her father and paternal grandmother; Hyperlipidemia in her father and paternal grandmother; Hypertension in her father and paternal grandmother; Thyroid disease in her mother.  ROS:   Please see the history of present illness.    All other systems reviewed and are negative.  EKGs/Labs/Other Studies Reviewed:    The following studies were reviewed today: EKG was sinus rhythm and poor anterior forces.   Recent Labs: 03/15/2022: ALT 13; BUN 8; Creatinine, Ser 0.55; Hemoglobin 12.0; Platelets 299.0; Potassium 3.8; Sodium 139 08/09/2022: TSH 1.51  Recent Lipid Panel    Component Value Date/Time   CHOL 168 06/07/2021 1016   TRIG 52 06/07/2021 1016   HDL 44 06/07/2021 1016   CHOLHDL 3.8 06/07/2021 1016   CHOLHDL 3.6 08/23/2013 0923   VLDL 9 08/23/2013 0923   LDLCALC 114 (H) 06/07/2021 1016    Physical Exam:    VS:  BP (!) 142/82   Pulse 76   Ht 5\' 8"  (1.727 m)   Wt (!) 309 lb (140.2 kg)   SpO2 95%   BMI 46.98 kg/m     Wt Readings from Last 3 Encounters:  10/18/22 (!) 309 lb (140.2 kg)  08/09/22 298 lb 12.8 oz (135.5 kg)  06/20/22 300 lb (136.1 kg)     GEN: Patient is in no acute distress HEENT: Normal NECK: No JVD; No carotid bruits LYMPHATICS: No lymphadenopathy CARDIAC: Hear sounds regular, 2/6 systolic murmur at the apex. RESPIRATORY:  Clear to auscultation without rales, wheezing or rhonchi  ABDOMEN: Soft, non-tender, non-distended MUSCULOSKELETAL:  No edema; No deformity  SKIN: Warm and dry NEUROLOGIC:  Alert and oriented x 3 PSYCHIATRIC:  Normal affect   Signed, 06/22/22, MD  10/18/2022 2:41 PM    [facility]

## 2022-10-18 NOTE — Patient Instructions (Signed)
Medication Instructions:  Your physician recommends that you continue on your current medications as directed. Please refer to the Current Medication list given to you today.  *If you need a refill on your cardiac medications before your next appointment, please call your pharmacy*   Lab Work: None If you have labs (blood work) drawn today and your tests are completely normal, you will receive your results only by: MyChart Message (if you have MyChart) OR A paper copy in the mail If you have any lab test that is abnormal or we need to change your treatment, we will call you to review the results.   Testing/Procedures: None   Follow-Up: At [facility], you and your health needs are our priority.  As part of our continuing mission to provide you with exceptional heart care, we have created designated Provider Care Teams.  These Care Teams include your primary Cardiologist (physician) and Advanced Practice Providers (APPs -  Physician Assistants and Nurse Practitioners) who all work together to provide you with the care you need, when you need it.  We recommend signing up for the patient portal called "MyChart".  Sign up information is provided on this After Visit Summary.  MyChart is used to connect with patients for Virtual Visits (Telemedicine).  Patients are able to view lab/test results, encounter notes, upcoming appointments, etc.  Non-urgent messages can be sent to your provider as well.   To learn more about what you can do with MyChart, go to [email or web address].    Your next appointment:   1 year(s)  The format for your next appointment:   In Person  Provider:   Rajan Revankar, MD  Other Instructions None  Important Information About Sugar       

## 2023-01-01 ENCOUNTER — Other Ambulatory Visit: Payer: Self-pay | Admitting: Family

## 2023-01-01 MED ORDER — AMLODIPINE BESYLATE 5 MG PO TABS: 5.0000 mg | ORAL_TABLET | Freq: Every day | ORAL | 0 refills | Status: DC

## 2023-02-01 ENCOUNTER — Emergency Department (HOSPITAL_COMMUNITY)
Admission: EM | Admit: 2023-02-01 | Discharge: 2023-02-01 | Disposition: A | Discharge status: Home / Self Care | Payer: 59 | Attending: Emergency Medicine | Admitting: Emergency Medicine

## 2023-02-01 ENCOUNTER — Encounter (HOSPITAL_COMMUNITY): Payer: Self-pay

## 2023-02-01 ENCOUNTER — Emergency Department (HOSPITAL_COMMUNITY): Payer: 59

## 2023-02-01 DIAGNOSIS — I10 Essential (primary) hypertension: Secondary | ICD-10-CM | POA: Insufficient documentation

## 2023-02-01 DIAGNOSIS — M25561 Pain in right knee: Secondary | ICD-10-CM

## 2023-02-01 DIAGNOSIS — W010XXA Fall on same level from slipping, tripping and stumbling without subsequent striking against object, initial encounter: Secondary | ICD-10-CM | POA: Diagnosis not present

## 2023-02-01 DIAGNOSIS — Z79899 Other long term (current) drug therapy: Secondary | ICD-10-CM | POA: Diagnosis not present

## 2023-02-01 DIAGNOSIS — Y990 Civilian activity done for income or pay: Secondary | ICD-10-CM | POA: Diagnosis not present

## 2023-02-01 DIAGNOSIS — M1711 Unilateral primary osteoarthritis, right knee: Secondary | ICD-10-CM

## 2023-02-01 MED ORDER — IBUPROFEN 600 MG PO TABS: 600.0000 mg | ORAL_TABLET | Freq: Three times a day (TID) | ORAL | 0 refills | Status: AC | PRN

## 2023-02-01 NOTE — ED Provider Notes (Signed)
[place] AT [facility] Provider Note   CSN: [id number] Arrival date & time: 02/01/23  1047     History  Chief Complaint  Patient presents with   Knee Pain    Diamond [name] is a 37 y.o. female with past medical history hypertension and iron deficiency anemia who presents to the ED complaining of right knee pain for the past 3 weeks.  Patient states prior to the onset of her knee pain she did slip and fall injuring her knee.  She notes that she works standing on her feet for 8 to 10 hours a day and has increased pain with weightbearing and ambulation but is able to do so.  She denies any wounds to the knee.  She denies a history of knee pain.  She denies any other injury or concerns today.      Home Medications Prior to Admission medications   Medication Sig Start Date End Date Taking? Authorizing Provider  ibuprofen (ADVIL) 600 MG tablet Take 1 tablet (600 mg total) by mouth every 8 (eight) hours as needed for up to 5 days for mild pain or moderate pain. 02/01/23 02/06/23 Yes Skai Lickteig L, PA-C  amLODipine (NORVASC) 5 MG tablet Take 1 tablet (5 mg total) by mouth daily. 01/01/23   Debbrah Alar, NP  ferrous sulfate 325 (65 FE) MG EC tablet Take 325 mg by mouth daily.    [provider]  levothyroxine (SYNTHROID) 175 MCG tablet Take 1 tablet (175 mcg total) by mouth daily before breakfast. 08/09/22   Philemon Kingdom, MD      Allergies    Penicillins    Review of Systems   Review of Systems  All other systems reviewed and are negative.   Physical Exam Updated Vital Signs BP (!) 177/100 (BP Location: Right Arm)   Pulse 91   Temp 97.9 F (36.6 C) (Oral)   Resp 20   Ht 5\' 8"  (1.727 m)   Wt (!) 140.2 kg   LMP 01/02/2023   SpO2 100%   BMI 46.98 kg/m  Physical Exam Vitals and nursing note reviewed.  Constitutional:      General: She is not in acute distress.    Appearance: Normal appearance.  HENT:     Head:  Normocephalic and atraumatic.     Mouth/Throat:     Mouth: Mucous membranes are moist.  Eyes:     Conjunctiva/sclera: Conjunctivae normal.  Cardiovascular:     Rate and Rhythm: Normal rate and regular rhythm.     Heart sounds: No murmur heard. Pulmonary:     Effort: Pulmonary effort is normal.     Breath sounds: Normal breath sounds.  Abdominal:     General: Abdomen is flat.     Palpations: Abdomen is soft.  Musculoskeletal:        General: No deformity. Normal range of motion.     Cervical back: Neck supple.     Comments: No tenderness to any aspect of right knee, no overlying wounds or skin changes, normal sensation, normal strength, no joint effusion, stable joint, 2+ DP and PT pulses, soft compartments, range of motion intact, no tenderness over right thigh, lower leg, or calf  Skin:    General: Skin is warm and dry.     Capillary Refill: Capillary refill takes less than 2 seconds.  Neurological:     Mental Status: She is alert. Mental status is at baseline.  Psychiatric:        Behavior:  Behavior normal.     ED Results / Procedures / Treatments   Labs (all labs ordered are listed, but only abnormal results are displayed) Labs Reviewed - No data to display  EKG None  Radiology DG Knee Complete 4 Views Right  Result Date: 02/01/2023 CLINICAL DATA:  Fall.  Popping of knee. EXAM: RIGHT KNEE - COMPLETE 4+ VIEW COMPARISON:  None Available. FINDINGS: There is no joint effusion. No signs of acute fracture or dislocation. Sharpening of the tibial spines. Mild medial compartment and patellofemoral compartment marginal spur formation. IMPRESSION: 1. No acute findings. 2. Mild osteoarthritis. Electronically Signed   By: Kerby Moors M.D.   On: 02/01/2023 11:44    Procedures Procedures    Medications Ordered in ED Medications - No data to display  ED Course/ Medical Decision Making/ A&P                             Medical Decision Making Amount and/or Complexity of Data  Reviewed Radiology: ordered. Decision-making details documented in ED Course.   Medical Decision Making:   Diamond [name] is a 37 y.o. female who presented to the ED today with right knee pain detailed above.    Patient's presentation is complicated by their history of obesity.  Complete initial physical exam performed, notably the patient  was in no acute distress.  Nontender knee, no joint instability, soft compartments, neurovascularly intact, motor strength intact, overall benign exam.  Reviewed and confirmed nursing documentation for past medical history, family history, social history.    Initial Assessment:   With the patient's presentation of right knee pain, most likely diagnosis is knee sprain. Differential diagnosis includes but is not limited to fracture, dislocation, strain, osteoarthritis, septic joint, patellofemoral syndrome, meniscus injury, ligament injury. This is most consistent with an acute complicated illness  Initial Plan:  XR to evaluate for bony pathology Objective evaluation as reviewed   Initial Study Results:   Radiology:  All images reviewed independently. Agree with radiology report at this time.   DG Knee Complete 4 Views Right  Result Date: 02/01/2023 CLINICAL DATA:  Fall.  Popping of knee. EXAM: RIGHT KNEE - COMPLETE 4+ VIEW COMPARISON:  None Available. FINDINGS: There is no joint effusion. No signs of acute fracture or dislocation. Sharpening of the tibial spines. Mild medial compartment and patellofemoral compartment marginal spur formation. IMPRESSION: 1. No acute findings. 2. Mild osteoarthritis. Electronically Signed   By: Kerby Moors M.D.   On: 02/01/2023 11:44    Final Assessment and Plan:   This is a 37 year old female who presents to the ED with right knee pain.  Patient reports slipping and possibly injuring the knee about 2 weeks ago.  She has been able to ambulate but does have increased pain with weightbearing and ambulation especially  for extensive periods of time.  No history of knee pain previous to this.  Overall, patient has a reassuring physical exam as above.  No significant findings.  X-ray negative for acute findings but some osteoarthritis.  Discussed with patient findings and will discharge home with NSAIDs with orthopedics follow-up as needed.  Patient given strict ED return precautions, all questions answered, and stable for discharge.   Clinical Impression:  1. Acute pain of right knee   2. Primary osteoarthritis of right knee      Data Unavailable           Final Clinical Impression(s) / ED Diagnoses Final diagnoses:  Acute pain of right knee  Primary osteoarthritis of right knee    Rx / DC Orders ED Discharge Orders          Ordered    ibuprofen (ADVIL) 600 MG tablet  Every 8 hours PRN        02/01/23 1223              Suzzette Righter, PA-C 02/01/23 1224    Regan Lemming, MD 02/02/23 [id number]

## 2023-02-01 NOTE — Discharge Instructions (Addendum)
Thank you for letting us take care of you today.  We see some arthritis in your knee but no broken or out of place bones or other new injuries.  Please take the NSAIDs as prescribed over the next few days to help better control pain and inflammation.  If you continue to have symptoms, please follow-up with your PCP and/or orthopedics.  I provided the information for our orthopedic on-call should she continue to have issues with your knee.  For any new injury or worsening symptoms such as severe knee pain, fever, inability to walk, or other new, concerning symptoms, please return to the nearest emergency department for reevaluation.

## 2023-02-01 NOTE — ED Triage Notes (Signed)
Pt c/o right knee pain x 2 weeks. Pt states that she has tried motrin, epsom salts, ace bandages without relief. Pt is on her feet all day for work (10 hours). Pt states when she stands it tightens up and pops.

## 2023-02-14 ENCOUNTER — Other Ambulatory Visit: Payer: Self-pay | Admitting: Family

## 2023-03-14 ENCOUNTER — Other Ambulatory Visit: Payer: Self-pay | Admitting: Family

## 2023-04-10 ENCOUNTER — Other Ambulatory Visit: Payer: Self-pay | Admitting: Family

## 2023-04-11 ENCOUNTER — Other Ambulatory Visit: Payer: Self-pay

## 2023-04-11 ENCOUNTER — Telehealth: Payer: Self-pay | Admitting: Family

## 2023-04-11 ENCOUNTER — Telehealth: Payer: Self-pay | Admitting: Cardiology

## 2023-04-11 MED ORDER — AMLODIPINE BESYLATE 5 MG PO TABS: 5.0000 mg | ORAL_TABLET | Freq: Every day | ORAL | 1 refills | Status: DC

## 2023-04-11 NOTE — Telephone Encounter (Signed)
*  STAT* If patient is at the pharmacy, call can be transferred to refill team.   1. Which medications need to be refilled? (please list name of each medication and dose if known)   amLODipine (NORVASC) 5 MG tablet   2. Which pharmacy/location (including street and city if local pharmacy) is medication to be sent to?  [id number] - [place], [place] - [address]   3. Do they need a 30 day or 90 day supply?  30 day  Patient stated she only has 3 tablets left.

## 2023-04-11 NOTE — Telephone Encounter (Signed)
Pt states she cannot come in before her appt in July, as her dad is in chemo and she has been going back home to care for him. She only has 4 pills left and is hoping pcp can fill this in the meantime.  Medication: amLODipine (NORVASC) 5 MG tablet  Has the patient contacted their pharmacy? No.   Preferred Pharmacy:   [phone] [place], [place] - [address] [place], [place] [address] Phone: [phone]  Fax: [phone]

## 2023-04-11 NOTE — Telephone Encounter (Signed)
[facility]ill of [facility]mlod[facility] to [facility] [address] in [place].

## 2023-04-11 NOTE — Telephone Encounter (Signed)
Rx sent 

## 2023-05-20 ENCOUNTER — Ambulatory Visit: Payer: 59 | Admitting: Family

## 2023-05-20 ENCOUNTER — Encounter: Payer: Self-pay | Admitting: Family

## 2023-05-20 VITALS — BP 139/81 | HR 88 | Temp 98.0°F | Resp 16 | Ht 68.0 in | Wt 313.0 lb

## 2023-05-20 DIAGNOSIS — D509 Iron deficiency anemia, unspecified: Secondary | ICD-10-CM

## 2023-05-20 DIAGNOSIS — E890 Postprocedural hypothyroidism: Secondary | ICD-10-CM

## 2023-05-20 DIAGNOSIS — Z0000 Encounter for general adult medical examination without abnormal findings: Secondary | ICD-10-CM

## 2023-05-20 DIAGNOSIS — I10 Essential (primary) hypertension: Secondary | ICD-10-CM | POA: Diagnosis not present

## 2023-05-20 LAB — COMPREHENSIVE METABOLIC PANEL
ALT: 25 U/L (ref 0–35)
AST: 18 U/L (ref 0–37)
Albumin: 4.4 g/dL (ref 3.5–5.2)
Alkaline Phosphatase: 63 U/L (ref 39–117)
BUN: 12 mg/dL (ref 6–23)
CO2: 26 mEq/L (ref 19–32)
Calcium: 9.8 mg/dL (ref 8.4–10.5)
Chloride: 106 mEq/L (ref 96–112)
Creatinine, Ser: 0.54 mg/dL (ref 0.40–1.20)
GFR: 116.3 mL/min (ref >60.00)
Glucose, Bld: 102 mg/dL — ABNORMAL HIGH (ref 70–99)
Potassium: 3.9 mEq/L (ref 3.5–5.1)
Sodium: 139 mEq/L (ref 135–145)
Total Bilirubin: 0.4 mg/dL (ref 0.2–1.2)
Total Protein: 7.1 g/dL (ref 6.0–8.3)

## 2023-05-20 LAB — LIPID PANEL
Cholesterol: 192 mg/dL (ref 0–200)
HDL: 45 mg/dL (ref >39.00)
LDL Cholesterol: 127 mg/dL — ABNORMAL HIGH (ref 0–99)
NonHDL: 147.1
Total CHOL/HDL Ratio: 4
Triglycerides: 102 mg/dL (ref 0.0–149.0)
VLDL: 20.4 mg/dL (ref 0.0–40.0)

## 2023-05-20 LAB — IRON: Iron: 43 ug/dL (ref 42–145)

## 2023-05-20 LAB — TSH: TSH: 1.4 u[IU]/mL (ref 0.35–5.50)

## 2023-05-20 LAB — FERRITIN: Ferritin: 17.9 ng/mL (ref 10.0–291.0)

## 2023-05-20 NOTE — Assessment & Plan Note (Addendum)
Continue healthy diet, exercise and weight loss efforts. Recommended flu shot and covid booster this fall. Pap up to date. Plan to start mammograms at 38.

## 2023-05-20 NOTE — Progress Notes (Signed)
Subjective:     Patient ID: Diamond [name], female    DOB: 08-15-1986, 37 y.o.   MRN: [id number]  Chief Complaint  Patient presents with   Annual Exam    HPI  Discussed the use of AI scribe software for clinical note transcription with the patient, who gave verbal consent to proceed.  History of Present Illness          Patient presents today for complete physical.  Immunizations:  up to date Diet: doing better now that she got back from vacation Wt Readings from Last 3 Encounters:  05/20/23 (!) 313 lb (142 kg)  02/01/23 (!) 309 lb (140.2 kg)  10/18/22 (!) 309 lb (140.2 kg)  Exercise: enjoys walking at least 20 minutes/day, gym on Saturdays and Sundays- weights and elliptical Pap Smear: due 5/25 (has apt with GYN) Mammogram: N/A will begin at 38 Vision: up to date Dental: up to date  The patient presents for a routine check-up. She reports recent travel and a brief period of unhealthy eating, but has since returned to a healthier diet and regular exercise, including walking and gym workouts. She has lost weight since returning to her healthier lifestyle. She has no current cough or cold symptoms, but reports ear congestion and a possible allergic reaction to a soap product causing lip peeling. She denies any leg swelling, urinary concerns, digestive concerns, unusual muscle or joint pain, frequent headaches, or current concerns about depression or anxiety. She reports no changes to family medical history, except for her father's recent diagnosis and treatment for cancer. She is currently taking amlodipine for blood pressure and iron supplements, and reports feeling well on her current dose of Synthroid for thyroid management.   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   Iron deficiency anemia 09/19/2016    Morbid obesity (HCC) 06/01/2021   Multiple thyroid nodules 04/2013   3 nodules per pt   Polyp of cervix 04/27/2021   Routine general medical examination at a health care facility 08/23/2013    Past Surgical History:  Procedure Laterality Date   APPENDECTOMY     "teenager"   FRACTURE SURGERY Right    "compound fracture of leg per pt"   HARDWARE REMOVAL     a month after fracture surgery   THYROIDECTOMY N/A 10/08/2013   Procedure: TOTAL THYROIDECTOMY;  Surgeon: Velora Heckler, MD;  Location: [facility];  Service: General;  Laterality: N/A;   TONSILLECTOMY AND ADENOIDECTOMY  2012    Family History  Problem Relation Age of Onset   Thyroid disease Mother        " had thyroid removed due to nodules per pt"   Hypertension Mother    Hyperlipidemia Father    Hypertension Father    Diabetes Father    Cancer Father        appendiceal cancer with partial colectomy   Alcoholism Maternal Grandfather    Cancer Paternal Grandmother        lung   Hyperlipidemia Paternal Grandmother    Hypertension Paternal Grandmother    Diabetes Paternal Grandmother     Social History   Socioeconomic History   Marital status: Single    Spouse name: Not on file   Number of children: Not on file   Years of education: Not on file   Highest education level: Not on  file  Occupational History   Not on file  Tobacco Use   Smoking status: Never   Smokeless tobacco: Never  Vaping Use   Vaping status: Never Used  Substance and Sexual Activity   Alcohol use: Yes    Comment: occasional   Drug use: No   Sexual activity: Yes    Partners: Male    Birth control/protection: None  Other Topics Concern   Not on file  Social History Narrative   Grew up in [place]   Bachelors in Social Work   Works for [employer] with her family   Enjoys movies, meals with family, water parks with family.   Family relocated together from [facility] exercise: walk 2 times a wk around the trackCaffeine use: sometimes    Social Determinants of Corporate investment banker Strain: Not on file  Food Insecurity: Not on file  Transportation Needs: Not on file  Physical Activity: Not on file  Stress: Not on file  Social Connections: Not on file  Intimate Partner Violence: Not on file    Outpatient Medications Prior to Visit  Medication Sig Dispense Refill   amLODipine (NORVASC) 5 MG tablet Take 1 tablet (5 mg total) by mouth daily. 90 tablet 1   ferrous sulfate 325 (65 FE) MG EC tablet Take 325 mg by mouth daily.     levothyroxine (SYNTHROID) 175 MCG tablet Take 1 tablet (175 mcg total) by mouth daily before breakfast. 90 tablet 3   No facility-administered medications prior to visit.    Allergies  Allergen Reactions   Penicillins Rash    Has patient had a PCN reaction causing immediate rash, facial/tongue/throat swelling, SOB or lightheadedness with hypotension: Y Has patient had a PCN reaction causing severe rash involving mucus membranes or skin necrosis: Y Has patient had a PCN reaction that required hospitalization: N Has patient had a PCN reaction occurring within the last 10 years: Y If all of the above answers are "NO", then may proceed with Cephalosporin use.     Review of Systems  Constitutional:  Negative for weight loss.  HENT:  Negative for congestion and hearing loss.        Some right ear congestion  Eyes:  Negative for blurred vision.  Respiratory:  Negative for cough.   Cardiovascular:  Negative for leg swelling.  Gastrointestinal:  Negative for blood in stool, constipation and diarrhea.  Genitourinary:  Negative for dysuria, frequency and hematuria.  Musculoskeletal:  Negative for joint pain and myalgias.  Skin:  Positive for rash (some peeling skin on right lip).  Psychiatric/Behavioral:         Denies depression/anxiety       Objective:    Physical Exam Constitutional:      General: She is not in acute distress.    Appearance: Normal appearance. She is  well-developed.  HENT:     Head: Normocephalic and atraumatic.     Right Ear: Tympanic membrane, ear canal and external ear normal.     Left Ear: Tympanic membrane, ear canal and external ear normal.  Eyes:     General: No scleral icterus.    Comments: Strabismus right eye  Neck:     Thyroid: No thyromegaly.  Cardiovascular:     Rate and Rhythm: Normal rate and regular rhythm.     Heart sounds: Normal heart sounds. No murmur heard. Pulmonary:     Effort: Pulmonary effort is normal. No respiratory distress.     Breath sounds: Normal  breath sounds. No wheezing.  Abdominal:     General: Bowel sounds are normal.     Palpations: Abdomen is soft.     Tenderness: There is no abdominal tenderness.  Musculoskeletal:        General: Deformity (right shin deformity from hx of compound fracture) present. No swelling.     Cervical back: Neck supple.  Skin:    General: Skin is warm and dry.  Neurological:     Mental Status: She is alert and oriented to person, place, and time.  Psychiatric:        Mood and Affect: Mood normal.        Behavior: Behavior normal.        Thought Content: Thought content normal.        Judgment: Judgment normal.      BP 139/81 (BP Location: Right Arm, Patient Position: Sitting, Cuff Size: Large)   Pulse 88   Temp 98 F (36.7 C) (Oral)   Resp 16   Ht 5\' 8"  (1.727 m)   Wt (!) 313 lb (142 kg)   SpO2 100%   BMI 47.59 kg/m  Wt Readings from Last 3 Encounters:  05/20/23 (!) 313 lb (142 kg)  02/01/23 (!) 309 lb (140.2 kg)  10/18/22 (!) 309 lb (140.2 kg)       Assessment & Plan:   Problem List Items Addressed This Visit       Unprioritized   Iron deficiency anemia (Chronic)    Continues iron supplement daily- update labs.        Relevant Orders   TSH   Iron   Ferritin   Routine general medical examination at a health care facility - Primary    Continue healthy diet, exercise and weight loss efforts. Recommended flu shot and covid booster this  fall. Pap up to date. Plan to start mammograms at 38.       Hypothyroidism, postsurgical    Clinically stable on synthroid 175 mcg, continue same. Continues to follow with endocrinology. Will update TSH.       Hypertension    Stable on amlodipine, continue same.       Relevant Orders   Lipid panel   Comp Met (CMET)    I am having Diamond [name] maintain her ferrous sulfate, levothyroxine, and amLODipine.  No orders of the defined types were placed in this encounter.

## 2023-05-20 NOTE — Patient Instructions (Signed)
VISIT SUMMARY:  During your recent visit, we discussed your overall health and any concerns you had. You mentioned a brief period of unhealthy eating during your travels, but you have since returned to a healthier diet and regular exercise. You also reported ear congestion and a reaction to a soap product causing lip peeling. You have no other current health concerns. Your father's recent cancer diagnosis was noted. You are currently taking amlodipine for blood pressure, iron supplements, and Synthroid for thyroid management, all of which you report feeling well on.  YOUR PLAN:  -HYPERTENSION: Your high blood pressure is well controlled with your current medication, amlodipine. Please continue taking amlodipine 5mg  daily.  -IRON DEFICIENCY ANEMIA: You are currently taking iron supplements for your iron deficiency anemia. We will check your complete blood count (CBC) and iron levels today. Please continue taking your iron supplements as prescribed.  -HYPOTHYROIDISM: Your thyroid condition is stable with your current dose of Synthroid. Please continue taking your current dose of Synthroid and follow up with Endocrinology.   -GENERAL HEALTH MAINTENANCE: You are up to date on your immunizations, except for your annual flu shot and COVID booster. I recommend getting these in September 2024. Your next Pap smear is due in May 2025, and you should start getting mammograms next year.  INSTRUCTIONS:  Please continue with your current medications and lifestyle changes. We will follow up in 6 months to check on your progress and address any new concerns. Remember to get your flu shot and COVID booster in September 2024, and schedule your Pap smear for May 2025 and start mammograms next year.

## 2023-05-20 NOTE — Assessment & Plan Note (Addendum)
Clinically stable on synthroid 175 mcg, continue same. Continues to follow with endocrinology. Will update TSH.

## 2023-05-20 NOTE — Assessment & Plan Note (Signed)
Continues iron supplement daily- update labs.

## 2023-05-20 NOTE — Assessment & Plan Note (Signed)
Stable on amlodipine, continue same.  

## 2023-07-01 ENCOUNTER — Encounter (HOSPITAL_COMMUNITY): Payer: Self-pay

## 2023-07-01 ENCOUNTER — Emergency Department (HOSPITAL_COMMUNITY)
Admission: EM | Admit: 2023-07-01 | Discharge: 2023-07-01 | Disposition: A | Discharge status: Home / Self Care | Payer: 59 | Attending: Emergency Medicine | Admitting: Emergency Medicine

## 2023-07-01 ENCOUNTER — Other Ambulatory Visit: Payer: Self-pay

## 2023-07-01 DIAGNOSIS — R059 Cough, unspecified: Secondary | ICD-10-CM | POA: Diagnosis present

## 2023-07-01 DIAGNOSIS — Z79899 Other long term (current) drug therapy: Secondary | ICD-10-CM | POA: Diagnosis not present

## 2023-07-01 DIAGNOSIS — I10 Essential (primary) hypertension: Secondary | ICD-10-CM | POA: Diagnosis not present

## 2023-07-01 DIAGNOSIS — U071 COVID-19: Secondary | ICD-10-CM | POA: Diagnosis not present

## 2023-07-01 LAB — SARS CORONAVIRUS 2 BY RT PCR: SARS Coronavirus 2 by RT PCR: POSITIVE — AB

## 2023-07-01 MED ORDER — ACETAMINOPHEN 500 MG PO TABS: 500.0000 mg | ORAL_TABLET | Freq: Four times a day (QID) | ORAL | 0 refills | Status: DC | PRN

## 2023-07-01 MED ORDER — BENZONATATE 100 MG PO CAPS: 100.0000 mg | ORAL_CAPSULE | Freq: Three times a day (TID) | ORAL | 0 refills | Status: DC

## 2023-07-01 NOTE — Discharge Instructions (Addendum)
Recommendations for at home COVID-19 symptoms management:  Please continue isolation at home.  If have acute worsening of symptoms please go to ER/urgent care for further evaluation. Check pulse oximetry and if below 90-92% please go to ER. The following supplements MAY help:  Vitamin C 500mg twice a day and Quercetin 250-500 mg twice a day Vitamin D3 2000 - 4000 u/day B Complex vitamins Zinc 75-100 mg/day Melatonin 6-10 mg at night (the optimal dose is unknown) Aspirin 81mg/day (if no history of bleeding issues)  

## 2023-07-01 NOTE — ED Provider Notes (Signed)
[place] EMERGENCY DEPARTMENT AT [facility] Provider Note   CSN: [id number] Arrival date & time: 07/01/23  1056     History  Chief Complaint  Patient presents with   Nasal Congestion   Cough   Sore Throat    Diamond [name] is a 37 y.o. female.  The history is provided by the patient and medical records. No language interpreter was used.  Cough Sore Throat     37 year old obese female with history of hypertension, thyroid disease, presenting with cold symptoms.  Patient report for the past 5 days she has had headache, congestion, sinus drainage, occasional nonproductive cough and overall not feeling well.  She been taking over-the-counter medication with some relief.  She mentions several of her coworkers had COVID and flu at her workplace which concerns her.  She does not endorse any nausea vomiting diarrhea no shortness of breath no chest pain no neck stiffness.  Home Medications Prior to Admission medications   Medication Sig Start Date End Date Taking? Authorizing Provider  amLODipine (NORVASC) 5 MG tablet Take 1 tablet (5 mg total) by mouth daily. 04/11/23   Sandford Craze, NP  ferrous sulfate 325 (65 FE) MG EC tablet Take 325 mg by mouth daily.    [provider]  levothyroxine (SYNTHROID) 175 MCG tablet Take 1 tablet (175 mcg total) by mouth daily before breakfast. 08/09/22   Carlus Pavlov, MD      Allergies    Penicillins    Review of Systems   Review of Systems  Respiratory:  Positive for cough.   All other systems reviewed and are negative.   Physical Exam Updated Vital Signs BP (!) 143/82 (BP Location: Left Arm)   Pulse 81   Temp 98.5 F (36.9 C) (Oral)   Resp 18   LMP 06/17/2023 (Approximate)   SpO2 100%  Physical Exam Vitals and nursing note reviewed.  Constitutional:      General: She is not in acute distress.    Appearance: She is well-developed. She is obese.  HENT:     Head: Atraumatic.  Eyes:      Conjunctiva/sclera: Conjunctivae normal.  Cardiovascular:     Rate and Rhythm: Normal rate and regular rhythm.     Pulses: Normal pulses.     Heart sounds: Normal heart sounds.  Pulmonary:     Effort: Pulmonary effort is normal.     Breath sounds: No wheezing, rhonchi or rales.  Abdominal:     Palpations: Abdomen is soft.     Tenderness: There is no abdominal tenderness.  Musculoskeletal:     Cervical back: Neck supple.  Skin:    Findings: No rash.  Neurological:     Mental Status: She is alert.  Psychiatric:        Mood and Affect: Mood normal.     ED Results / Procedures / Treatments   Labs (all labs ordered are listed, but only abnormal results are displayed) Labs Reviewed  SARS CORONAVIRUS 2 BY RT PCR    EKG None  Radiology No results found.  Procedures Procedures    Medications Ordered in ED Medications - No data to display  ED Course/ Medical Decision Making/ A&P                                 Medical Decision Making  BP (!) 143/82 (BP Location: Left Arm)   Pulse 81   Temp 98.5 F (  36.9 C) (Oral)   Resp 18   LMP 06/17/2023 (Approximate)   SpO2 100%   2:22 PM 37 year old obese female with history of hypertension, thyroid disease, presenting with cold symptoms.  Patient report for the past 5 days she has had headache, congestion, sinus drainage, occasional nonproductive cough and overall not feeling well.  She been taking over-the-counter medication with some relief.  She mentions several of her coworkers had COVID and flu at her workplace which concerns her.  She does not endorse any nausea vomiting diarrhea no shortness of breath no chest pain no neck stiffness.  On exam this is an obese female resting comfortably in bed appears to be in no acute discomfort.  Ear nose and throat exam unremarkable no significant tenderness to percussion of the sinuses heart with normal rate and rhythm, lungs are clear to auscultation bilaterally abdomen is soft  nontender  Vital signs overall reassuring no fever no hypoxia.  Symptoms likely cold symptom, COVID test have been ordered.  3:34 PM Labs obtained and remarkable for positive COVID test which is consistent with patient's presentation.  Patient without any hypoxia, no indication for antiviral medication at this time, will provide supportive care but otherwise she is stable to discharge.  No suspicion for pneumonia pneumonia chest x-ray considered but not performed.  Social determinant of health considered.        Final Clinical Impression(s) / ED Diagnoses Final diagnoses:  COVID-19 virus infection    Rx / DC Orders ED Discharge Orders          Ordered    acetaminophen (TYLENOL) 500 MG tablet  Every 6 hours PRN        07/01/23 1521    benzonatate (TESSALON) 100 MG capsule  Every 8 hours        07/01/23 1521              Fayrene Helper, PA-C 07/01/23 1535    Bethann Berkshire, MD 07/02/23 1116

## 2023-07-01 NOTE — ED Triage Notes (Signed)
Pt arrived pov, reports sore throat, cough and congestion for 6 days. States has been trying OTC meds, no relief. Thinks she has a sinus infection. No other symptoms reported.

## 2023-08-15 ENCOUNTER — Ambulatory Visit: Payer: 59 | Admitting: Internal Medicine

## 2023-08-15 ENCOUNTER — Encounter: Payer: Self-pay | Admitting: Internal Medicine

## 2023-08-15 VITALS — BP 130/80 | HR 74 | Ht 68.0 in | Wt 312.4 lb

## 2023-08-15 DIAGNOSIS — E890 Postprocedural hypothyroidism: Secondary | ICD-10-CM | POA: Diagnosis not present

## 2023-08-15 LAB — TSH: TSH: 1.87 u[IU]/mL (ref 0.35–5.50)

## 2023-08-15 LAB — T4, FREE: Free T4: 1.23 ng/dL (ref 0.60–1.60)

## 2023-08-15 MED ORDER — LEVOTHYROXINE SODIUM 175 MCG PO TABS: 175.0000 ug | ORAL_TABLET | Freq: Every day | ORAL | 3 refills | Status: DC

## 2023-08-15 NOTE — Patient Instructions (Signed)
Please stop at the lab.  Please continue Levothyroxine 175 mcg daily.  Take the thyroid hormone every day, with water, at least 30 minutes before breakfast, separated by at least 4 hours from: - acid reflux medications - calcium - iron - multivitamins  Please return in 1 year.

## 2023-08-15 NOTE — Progress Notes (Signed)
Patient ID: Diamond [name], female   DOB: 08/23/1986, 37 y.o.   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 Last visit 1 year  ago.  Interim history: Years ago, she lost a significant amount of weight after decreasing fast food and the intake of sweet drinks: 356 lbs 04/2021 >> 293 lbs.  She gained 22 pounds back since then, which 15 since last visit. She had COVID19 06/2023 >> resolved. She had sinusitis, congestion. No cough, fever.  Her mother also had COVID-19 at the same time and she had a stroke afterwards.  She is doing better now.  Reviewed history: Patient was diagnosed with enlarged thyroid approximately 1 year ago - at [facility]. She had a thyroid ultrasound that showed 3 thyroid nodules. The 2 largest nodules were biopsied >> benign (10/2012).     She was describing a "lump" mid neck - for 2 years, + hoarseness for 2 years, + dysphagia - meats but also swallowing saliva laying down/no odynophagia, + SOB with lying down.  A Barium swallow showed mild impression from thyroid on Esophagus >> I referred her to surgery >> had total thyroidectomy 10/08/2013 >> final pathology shows nodular hyperplasia. Postoperative serum calcium level was normal at 9.6. She was started on Synthroid 100 mcg daily.  Dose was changed afterwards.  Last visit with Dr Gerrit Friends was on 12/13/2013.  She is on levothyroxine 175 mcg (last dose increase 01/2019) daily, taken: - in am - fasting - at least 30 min from b'fast - no Ca - + MVI in the pm - no PPIs - + Fe every day, at night - no Biotin anymore  Reviewed patient's TFTs: Lab Results  Component Value Date   TSH 1.40 05/20/2023   TSH 1.51 08/09/2022   TSH 0.57 03/15/2022   TSH 2.06 04/12/2021   TSH 1.53 09/08/2020   TSH 3.31 12/09/2019   TSH 4.32 04/01/2019   TSH 8.20 (H) 01/11/2019   TSH 8.24 (H) 12/08/2018   TSH 0.90 12/08/2017    FREET4 1.23 08/09/2022   FREET4 1.45 04/12/2021   FREET4 1.24 12/09/2019   FREET4 1.19 04/01/2019   FREET4 1.08 01/11/2019   FREET4 1.21 12/08/2018   FREET4 1.71 (H) 12/08/2017   FREET4 1.21 09/18/2016   FREET4 1.23 01/11/2016   FREET4 0.98 09/19/2015   Pt denies: - feeling nodules in neck - dysphagia - choking She continues to have hoarseness, which was present before the surgery.  Pt also has a history of  iron deficiency anemia.  She works in the post [employer].   ROS: + see HPI  I reviewed pt's medications, allergies, PMH, social hx, family hx, and changes were documented in the history of present illness. Otherwise, unchanged from my initial visit note.  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   Iron deficiency anemia 09/19/2016   Morbid obesity (HCC) 06/01/2021   Multiple thyroid nodules 04/2013   3 nodules per pt   Polyp of cervix 04/27/2021   Routine general medical examination at a health care facility 08/23/2013   Past Surgical History:  Procedure Laterality Date   APPENDECTOMY     "teenager"   FRACTURE SURGERY Right    "compound fracture of leg per pt"   HARDWARE REMOVAL     a month after fracture surgery   THYROIDECTOMY N/A 10/08/2013   Procedure: TOTAL THYROIDECTOMY;  Surgeon: Huey Bienenstock  Gerkin, MD;  Location: [facility];  Service: General;  Laterality: N/A;   TONSILLECTOMY AND ADENOIDECTOMY  2012   Social History   Socioeconomic History   Marital status: Single    Spouse name: Not on file   Number of children: Not on file   Years of education: Not on file   Highest education level: Not on file  Occupational History   Not on file  Tobacco Use   Smoking status: Never   Smokeless tobacco: Never  Vaping Use   Vaping status: Never Used  Substance and Sexual Activity   Alcohol use: Yes    Comment: occasional   Drug use: No   Sexual activity: Yes    Partners: Male    Birth  control/protection: None  Other Topics Concern   Not on file  Social History Narrative   Grew up in [place]   Bachelors in Social Work   Works for [employer] with her family   Enjoys movies, meals with family, water parks with family.   [place] together from [place] exercise: walk 2 times a wk around the trackCaffeine use: sometimes   Social Determinants of Corporate investment banker Strain: Not on file  Food Insecurity: Not on file  Transportation Needs: Not on file  Physical Activity: Not on file  Stress: Not on file  Social Connections: Not on file  Intimate Partner Violence: Not on file   Current Outpatient Medications on File Prior to Visit  Medication Sig Dispense Refill   acetaminophen (TYLENOL) 500 MG tablet Take 1 tablet (500 mg total) by mouth every 6 (six) hours as needed. 30 tablet 0   amLODipine (NORVASC) 5 MG tablet Take 1 tablet (5 mg total) by mouth daily. 90 tablet 1   benzonatate (TESSALON) 100 MG capsule Take 1 capsule (100 mg total) by mouth every 8 (eight) hours. 21 capsule 0   ferrous sulfate 325 (65 FE) MG EC tablet Take 325 mg by mouth daily.     levothyroxine (SYNTHROID) 175 MCG tablet Take 1 tablet (175 mcg total) by mouth daily before breakfast. 90 tablet 3   No current facility-administered medications on file prior to visit.   Allergies  Allergen Reactions   Penicillins Rash    Has patient had a PCN reaction causing immediate rash, facial/tongue/throat swelling, SOB or lightheadedness with hypotension: Y Has patient had a PCN reaction causing severe rash involving mucus membranes or skin necrosis: Y Has patient had a PCN reaction that required hospitalization: N Has patient had a PCN reaction occurring within the last 10 years: Y If all of the above answers are "NO", then may proceed with Cephalosporin use.    Family History  Problem Relation Age of Onset   Thyroid disease Mother        " had thyroid removed due to nodules per  pt"   Hypertension Mother    Hyperlipidemia Father    Hypertension Father    Diabetes Father    Cancer Father        appendiceal cancer with partial colectomy   Alcoholism Maternal Grandfather    Cancer Paternal Grandmother        lung   Hyperlipidemia Paternal Grandmother    Hypertension Paternal Grandmother    Diabetes Paternal Grandmother     PE: BP 130/80   Pulse 74   Ht 5\' 8"  (1.727 m)   Wt (!) 312 lb 6.4 oz (141.7 kg)   SpO2 97%   BMI  47.50 kg/m    Wt Readings from Last 5 Encounters:  08/15/23 (!) 312 lb 6.4 oz (141.7 kg)  05/20/23 (!) 313 lb (142 kg)  02/01/23 (!) 309 lb (140.2 kg)  10/18/22 (!) 309 lb (140.2 kg)  08/09/22 298 lb 12.8 oz (135.5 kg)   Constitutional: overweight, in NAD Eyes:  EOMI, no exophthalmos ENT: no neck masses, no cervical lymphadenopathy Cardiovascular: RRR, No MRG Respiratory: CTA B Musculoskeletal: no deformities Skin:no rashes Neurological: no tremor with outstretched hands  ASSESSMENT: 1.  Postsurgical hypothyroidism  - thyroid ultrasound at [facility] 09/09/2012: Right lobe is 7.3 x 2.5 x 2.2 cm. On the right, there is a solid 2.2 x 1.2 x 1.6 cm mass. The second mass measures 1.0 x 1.2 x 1.2 cm. A third mass measures 1.6 x 1.1 x 1.3 cm. There are several subcentimeter masses on the right as well. Left lobe is 8.0 x 3.8 x 2.8 cm There is a mass in the left lobe measuring 5.0 x 4.9 x 3.9 cm, with few tiny calcifications. Isthmus is 8 mm in thickness.  In the isthmus, there is a mass measuring 1.8 x 1.7 x 1.4 cm all masses are solid. Enlarged thyroid with multiple solid masses.  - thyroid FNA (10/2012):  left sided 4 cm nodule: benign, with Hurthle cell and cystic changes right-sided 2 cm nodule: benign, with Hurthle cell and cystic changes  - Ba swallow 08/27/2013: Slight impression on the anterior lower cervical esophagus, possibly related to the patient's known multinodular goiter. Brief sticking of the 13 mm barium  tablet in the cervical esophagus.  - total thyroidectomy 10/08/2013 >> benign pathology  Plan: 1.  Postsurgical hypothyroidism -Developed after total thyroidectomy in 10/2013 for compressive multinodular goiter.  Final pathology was benign. - latest thyroid labs reviewed with pt. >> normal: Lab Results  Component Value Date   TSH 1.40 05/20/2023  - she continues on LT4 175 mcg daily - pt feels good on this dose.  She gained some weight recently due to traveling frequently to [place] to visit her mother and eating out more, but she started to lose the weight after she stopped traveling. - we discussed about taking the thyroid hormone every day, with water, >30 minutes before breakfast, separated by >4 hours from acid reflux medications, calcium, iron, multivitamins. Pt. is taking it correctly. - will check thyroid tests today: TSH and fT4 - If labs are abnormal, she will need to return for repeat TFTs in 1.5 months - OTW I will see her back in 1 year  Needs refills - [facility] [address]  Component     Latest Ref Rng 08/15/2023  T4,Free(Direct)     0.60 - 1.60 ng/dL 1.61   TSH     0.96 - 0.45 uIU/mL 1.87   TFTs are normal.  Will refill her levothyroxine.  Carlus Pavlov, MD PhD [facility]

## 2023-11-19 ENCOUNTER — Telehealth: Payer: Self-pay | Admitting: Family

## 2023-11-21 ENCOUNTER — Ambulatory Visit: Payer: 59 | Admitting: Family

## 2023-11-21 NOTE — Telephone Encounter (Signed)
1/15 refill request.

## 2024-01-09 ENCOUNTER — Telehealth: Payer: Self-pay | Admitting: Family

## 2024-01-09 NOTE — Telephone Encounter (Signed)
 Please contact pt to schedule a follow up visit.

## 2024-01-09 NOTE — Telephone Encounter (Signed)
 Pt stated she will call back next week.

## 2024-02-08 ENCOUNTER — Other Ambulatory Visit: Payer: Self-pay | Admitting: Family

## 2024-02-08 NOTE — Telephone Encounter (Signed)
 Please contact patient to schedule follow up visit for blood pressure.

## 2024-02-09 NOTE — Telephone Encounter (Signed)
Lvm2 sched  

## 2024-05-08 ENCOUNTER — Other Ambulatory Visit: Payer: Self-pay | Admitting: Family

## 2024-05-27 ENCOUNTER — Ambulatory Visit (INDEPENDENT_AMBULATORY_CARE_PROVIDER_SITE_OTHER): Admitting: Family

## 2024-05-27 ENCOUNTER — Ambulatory Visit: Payer: Self-pay | Admitting: Family

## 2024-05-27 VITALS — BP 140/78 | HR 88 | Temp 98.1°F | Resp 16 | Ht 68.0 in | Wt 320.0 lb

## 2024-05-27 DIAGNOSIS — I10 Essential (primary) hypertension: Secondary | ICD-10-CM

## 2024-05-27 DIAGNOSIS — D509 Iron deficiency anemia, unspecified: Secondary | ICD-10-CM | POA: Diagnosis not present

## 2024-05-27 DIAGNOSIS — F4320 Adjustment disorder, unspecified: Secondary | ICD-10-CM | POA: Diagnosis not present

## 2024-05-27 DIAGNOSIS — E890 Postprocedural hypothyroidism: Secondary | ICD-10-CM | POA: Diagnosis not present

## 2024-05-27 LAB — IBC PANEL
Iron: 48 ug/dL (ref 42–145)
Saturation Ratios: 12.2 % — ABNORMAL LOW (ref 20.0–50.0)
TIBC: 394.8 ug/dL (ref 250.0–450.0)
Transferrin: 282 mg/dL (ref 212.0–360.0)

## 2024-05-27 LAB — CBC WITH DIFFERENTIAL/PLATELET
Basophils Absolute: 0 K/uL (ref 0.0–0.1)
Basophils Relative: 0.2 % (ref 0.0–3.0)
Eosinophils Absolute: 0.1 K/uL (ref 0.0–0.7)
Eosinophils Relative: 2.3 % (ref 0.0–5.0)
HCT: 36.2 % (ref 36.0–46.0)
Hemoglobin: 11.9 g/dL — ABNORMAL LOW (ref 12.0–15.0)
Lymphocytes Relative: 34.6 % (ref 12.0–46.0)
Lymphs Abs: 1.9 K/uL (ref 0.7–4.0)
MCHC: 32.8 g/dL (ref 30.0–36.0)
MCV: 82.7 fl (ref 78.0–100.0)
Monocytes Absolute: 0.4 K/uL (ref 0.1–1.0)
Monocytes Relative: 7.5 % (ref 3.0–12.0)
Neutro Abs: 3 K/uL (ref 1.4–7.7)
Neutrophils Relative %: 55.4 % (ref 43.0–77.0)
Platelets: 340 K/uL (ref 150.0–400.0)
RBC: 4.38 Mil/uL (ref 3.87–5.11)
RDW: 16.4 % — ABNORMAL HIGH (ref 11.5–15.5)
WBC: 5.4 K/uL (ref 4.0–10.5)

## 2024-05-27 LAB — COMPREHENSIVE METABOLIC PANEL WITH GFR
ALT: 19 U/L (ref 0–35)
AST: 16 U/L (ref 0–37)
Albumin: 4.4 g/dL (ref 3.5–5.2)
Alkaline Phosphatase: 63 U/L (ref 39–117)
BUN: 10 mg/dL (ref 6–23)
CO2: 26 meq/L (ref 19–32)
Calcium: 9.3 mg/dL (ref 8.4–10.5)
Chloride: 105 meq/L (ref 96–112)
Creatinine, Ser: 0.48 mg/dL (ref 0.40–1.20)
GFR: 118.79 mL/min (ref >60.00)
Glucose, Bld: 109 mg/dL — ABNORMAL HIGH (ref 70–99)
Potassium: 3.7 meq/L (ref 3.5–5.1)
Sodium: 138 meq/L (ref 135–145)
Total Bilirubin: 0.5 mg/dL (ref 0.2–1.2)
Total Protein: 7.1 g/dL (ref 6.0–8.3)

## 2024-05-27 NOTE — Assessment & Plan Note (Addendum)
 BP Readings from Last 3 Encounters:  05/27/24 (!) 140/78  08/15/23 130/80  07/01/23 (!) 133/93    Hypertension slightly above target, likely due to stress and weight gain. Current medication: amlodipine  5 mg daily. - Encourage continued weight loss efforts. - Recheck blood pressure in two months. - Adjust amlodipine  dosage if necessary.

## 2024-05-27 NOTE — Assessment & Plan Note (Signed)
 Lab Results  Component Value Date   WBC 5.3 03/15/2022   HGB 12.0 03/15/2022   HCT 36.4 03/15/2022   MCV 84.6 03/15/2022   PLT 299.0 03/15/2022   Continues iron/MVI. Update cbc and iron studies.

## 2024-05-27 NOTE — Patient Instructions (Signed)
 VISIT SUMMARY:  Today, we discussed your blood pressure management and overall health. You have made significant lifestyle changes, including reducing alcohol and meat consumption and increasing physical activity, which have positively impacted your weight and health.  YOUR PLAN:  HYPERTENSION: Your blood pressure is currently above the target, likely due to stress and recent weight gain. -Recheck your blood pressure in two months. -We may adjust your amlodipine  dosage if necessary. -Continue your efforts to lose weight.  IRON DEFICIENCY ANEMIA: You are currently taking a daily iron supplement, but your blood count and iron levels have not been recently evaluated. -We will check your blood count and iron levels.  GENERAL HEALTH MAINTENANCE: You have made positive lifestyle changes and received hepatitis B and HPV vaccinations. -Continue with your regular exercise and healthy diet. -We will verify your vaccination history for hepatitis B and HPV.

## 2024-05-27 NOTE — Assessment & Plan Note (Addendum)
 Lab Results  Component Value Date   TSH 1.87 08/15/2023   Continues synthroid . Managed by Endocrinology.

## 2024-05-27 NOTE — Progress Notes (Signed)
 Subjective:     Patient ID: Diamond [name], female    DOB: 29-Feb-1986, 38 y.o.   MRN: [id number]  Chief Complaint  Patient presents with   Annual Exam    HPI  Discussed the use of AI scribe software for clinical note transcription with the patient, who gave verbal consent to proceed.  History of Present Illness Diamond [name] is a 38 year old female with hypertension who presents for follow-up and blood pressure management.  She is experiencing significant stress due to her mother's recent passing, which has affected her lifestyle and contributed to weight gain. She has gained 28 pounds but has recently lost 7 pounds in two weeks by reducing alcohol and meat consumption and increasing physical activity to three days a week at the gym. She has abstained from alcohol for the past two weeks.  She is currently taking amlodipine  5 mg daily for hypertension and an iron tablet daily. Her family history includes both parents having had cancer; her mother had stage three cancer, and her father had colon cancer but is now in remission. Both parents were smokers.      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   Iron deficiency anemia 09/19/2016   Morbid obesity (HCC) 06/01/2021   Multiple thyroid  nodules 04/2013   3 nodules per pt   Polyp of cervix 04/27/2021   Routine general medical examination at a health care facility 08/23/2013    Past Surgical History:  Procedure Laterality Date   APPENDECTOMY     teenager   FRACTURE SURGERY Right    compound fracture of leg per pt   HARDWARE REMOVAL     a month after fracture surgery   THYROIDECTOMY N/A 10/08/2013   Procedure: TOTAL THYROIDECTOMY;  Surgeon: Krystal CHRISTELLA Spinner, MD;  Location: [facility];  Service: General;  Laterality: N/A;   TONSILLECTOMY AND ADENOIDECTOMY   2012    Family History  Problem Relation Age of Onset   Thyroid  disease Mother         had thyroid  removed due to nodules per pt   Hypertension Mother    Cancer - Lung Mother        smoker, died from complication of blood clot to smal intestine   Hyperlipidemia Father    Hypertension Father    Diabetes Father    Cancer Father        appendiceal cancer with partial colectomy   Alcoholism Maternal Grandfather    Cancer Paternal Grandmother        lung   Hyperlipidemia Paternal Grandmother    Hypertension Paternal Grandmother    Diabetes Paternal Grandmother     Social History   Socioeconomic History   Marital status: Single    Spouse name: Not on file   Number of children: Not on file   Years of education: Not on file   Highest education level: Bachelor's degree (e.g., BA, AB, BS)  Occupational History   Not on file  Tobacco Use   Smoking status: Never   Smokeless tobacco: Never  Vaping Use   Vaping status: Never Used  Substance and Sexual Activity   Alcohol use: Yes    Comment: occasional   Drug use: No   Sexual activity: Yes    Partners: Male    Birth control/protection: None  Other Topics Concern  Not on file  Social History Narrative   Grew up in [place]    Bachelors in Social Work   Works for [employer] with her family   Enjoys movies, meals with family, water parks with family.   Family relocated together from [facility] exercise: walk 2 times a wk around the trackCaffeine use: sometimes   Social Drivers of Corporate investment banker Strain: Low Risk  (05/27/2024)   Overall Financial Resource Strain (CARDIA)    Difficulty of Paying Living Expenses: Not hard at all  Food Insecurity: No Food Insecurity (05/27/2024)   Hunger Vital Sign    Worried About Running Out of Food in the Last Year: Never true    Ran Out of Food in the Last Year: Never true  Transportation Needs: No Transportation Needs (05/27/2024)   PRAPARE - Scientist, research (physical sciences) (Medical): No    Lack of Transportation (Non-Medical): No  Physical Activity: Sufficiently Active (05/27/2024)   Exercise Vital Sign    Days of Exercise per Week: 3 days    Minutes of Exercise per Session: 60 min  Stress: No Stress Concern Present (05/27/2024)   [employer] of Occupational Health - Occupational Stress Questionnaire    Feeling of Stress: Not at all  Social Connections: Socially Isolated (05/27/2024)   Social Connection and Isolation Panel    Frequency of Communication with Friends and Family: More than three times a week    Frequency of Social Gatherings with Friends and Family: Once a week    Attends Religious Services: Never    Database administrator or Organizations: No    Attends Engineer, structural: Not on file    Marital Status: Never married  Intimate Partner Violence: Not on file    Outpatient Medications Prior to Visit  Medication Sig Dispense Refill   amLODipine  (NORVASC ) 5 MG tablet TAKE 1 TABLET (5 MG TOTAL) BY MOUTH DAILY. 90 tablet 0   ferrous sulfate 325 (65 FE) MG EC tablet Take 325 mg by mouth daily.     levothyroxine  (SYNTHROID ) 175 MCG tablet Take 1 tablet (175 mcg total) by mouth daily before breakfast. 90 tablet 3   No facility-administered medications prior to visit.    Allergies  Allergen Reactions   Penicillins Rash    Has patient had a PCN reaction causing immediate rash, facial/tongue/throat swelling, SOB or lightheadedness with hypotension: Y Has patient had a PCN reaction causing severe rash involving mucus membranes or skin necrosis: Y Has patient had a PCN reaction that required hospitalization: N Has patient had a PCN reaction occurring within the last 10 years: Y If all of the above answers are NO, then may proceed with Cephalosporin use.     ROS See HPI    Objective:    Physical Exam Constitutional:      General: She is not in acute distress.    Appearance: Normal appearance. She is  well-developed.  HENT:     Head: Normocephalic and atraumatic.     Right Ear: External ear normal.     Left Ear: External ear normal.  Eyes:     General: No scleral icterus. Neck:     Thyroid : No thyromegaly.  Cardiovascular:     Rate and Rhythm: Normal rate and regular rhythm.     Heart sounds: Normal heart sounds. No murmur heard. Pulmonary:     Effort: Pulmonary effort is normal. No respiratory distress.     Breath sounds: Normal  breath sounds. No wheezing.  Musculoskeletal:     Cervical back: Neck supple.  Skin:    General: Skin is warm and dry.  Neurological:     Mental Status: She is alert and oriented to person, place, and time.  Psychiatric:        Mood and Affect: Mood normal.        Behavior: Behavior normal.        Thought Content: Thought content normal.        Judgment: Judgment normal.      BP (!) 140/78   Pulse 88   Temp 98.1 F (36.7 C) (Oral)   Resp 16   Ht 5' 8 (1.727 m)   Wt (!) 320 lb (145.2 kg)   SpO2 100%   BMI 48.66 kg/m  Wt Readings from Last 3 Encounters:  05/27/24 (!) 320 lb (145.2 kg)  08/15/23 (!) 312 lb 6.4 oz (141.7 kg)  05/20/23 (!) 313 lb (142 kg)       Assessment & Plan:   Problem List Items Addressed This Visit       Unprioritized   Iron deficiency anemia (Chronic)   Lab Results  Component Value Date   WBC 5.3 03/15/2022   HGB 12.0 03/15/2022   HCT 36.4 03/15/2022   MCV 84.6 03/15/2022   PLT 299.0 03/15/2022   Continues iron/MVI. Update cbc and iron studies.       Relevant Orders   CBC w/Diff   IBC panel   Hypothyroidism, postsurgical - Primary   Lab Results  Component Value Date   TSH 1.87 08/15/2023   Continues synthroid . Managed by Endocrinology.       Hypertension   BP Readings from Last 3 Encounters:  05/27/24 (!) 140/78  08/15/23 130/80  07/01/23 (!) 133/93    Hypertension slightly above target, likely due to stress and weight gain. Current medication: amlodipine  5 mg daily. - Encourage  continued weight loss efforts. - Recheck blood pressure in two months. - Adjust amlodipine  dosage if necessary.      Relevant Orders   Comp Met (CMET)   Grief reaction   Patient is grieving normally following the loss of her mother.  She is focusing on her health and weight loss.  Monitor.        I am having Keisha Amer maintain her ferrous sulfate, levothyroxine , and amLODipine .  No orders of the defined types were placed in this encounter.

## 2024-05-27 NOTE — Assessment & Plan Note (Signed)
 Patient is grieving normally following the loss of her mother.  She is focusing on her health and weight loss.  Monitor.

## 2024-07-25 ENCOUNTER — Other Ambulatory Visit: Payer: Self-pay | Admitting: Internal Medicine

## 2024-07-25 DIAGNOSIS — E890 Postprocedural hypothyroidism: Secondary | ICD-10-CM

## 2024-08-10 ENCOUNTER — Other Ambulatory Visit: Payer: Self-pay | Admitting: Family

## 2024-08-16 ENCOUNTER — Ambulatory Visit: Payer: 59 | Admitting: Internal Medicine

## 2024-08-16 ENCOUNTER — Encounter: Payer: Self-pay | Admitting: Internal Medicine

## 2024-08-16 ENCOUNTER — Other Ambulatory Visit

## 2024-08-16 VITALS — BP 138/80 | HR 92 | Ht 68.0 in | Wt 325.8 lb

## 2024-08-16 DIAGNOSIS — E890 Postprocedural hypothyroidism: Secondary | ICD-10-CM

## 2024-08-16 NOTE — Patient Instructions (Signed)
Please stop at the lab.  Please continue Levothyroxine 175 mcg daily.  Take the thyroid hormone every day, with water, at least 30 minutes before breakfast, separated by at least 4 hours from: - acid reflux medications - calcium - iron - multivitamins  Please return in 1 year.

## 2024-08-16 NOTE — Progress Notes (Unsigned)
 Patient ID: Diamond [name], female   DOB: July 03, 1986, 38 y.o.   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 Last visit 1 year  ago.  Interim history: She previously lost a significant amount of weight after decreasing fast food and the intake of sweet drinks: 356 lbs 04/2021 >> 293 lbs. She did gain weight afterwards, 15 pounds before last visit.  Since last visit, she gained 5 more pounds.  However, she is now starting to adjust her diet and losing weight. She had major stress since last OV: mom died of lung cancer and strokes 04/2024.  She was under a lot of stress and staying with her in the hospital.    Reviewed history: Patient was diagnosed with enlarged thyroid  approximately 1 year ago - at [facility]. She had a thyroid  ultrasound that showed 3 thyroid  nodules. The 2 largest nodules were biopsied >> benign (10/2012).     She was describing a lump mid neck - for 2 years, + hoarseness for 2 years, + dysphagia - meats but also swallowing saliva laying down/no odynophagia, + SOB with lying down.  A Barium swallow showed mild impression from thyroid  on Esophagus >> I referred her to surgery >> had total thyroidectomy 10/08/2013 >> final pathology shows nodular hyperplasia. Postoperative serum calcium  level was normal at 9.6. She was started on Synthroid  100 mcg daily.  Dose was changed afterwards.  Last visit with Dr Eletha was on 12/13/2013.  She is on levothyroxine  175 mcg (last dose increase 01/2019) daily, taken: - in am - fasting - at least 30 min from b'fast - no Ca - + MVI in the evening - no PPIs - + Fe every day, at night - no Biotin anymore  Reviewed patient's TFTs: Lab Results  Component Value Date   TSH 1.87 08/15/2023   TSH 1.40 05/20/2023   TSH 1.51 08/09/2022   TSH 0.57 03/15/2022   TSH 2.06 04/12/2021   TSH 1.53 09/08/2020   TSH 3.31 12/09/2019   TSH  4.32 04/01/2019   TSH 8.20 (H) 01/11/2019   TSH 8.24 (H) 12/08/2018   FREET4 1.23 08/15/2023   FREET4 1.23 08/09/2022   FREET4 1.45 04/12/2021   FREET4 1.24 12/09/2019   FREET4 1.19 04/01/2019   FREET4 1.08 01/11/2019   FREET4 1.21 12/08/2018   FREET4 1.71 (H) 12/08/2017   FREET4 1.21 09/18/2016   FREET4 1.23 01/11/2016   Pt denies: - feeling nodules in neck - dysphagia - choking She continues to have hoarseness, which was present before the surgery.  Pt also has a history of  iron deficiency anemia.  She works in the post office.   ROS: + see HPI  I reviewed pt's medications, allergies, PMH, social hx, family hx, and changes were documented in the history of present illness. Otherwise, unchanged from my initial visit note.  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   Iron deficiency anemia 09/19/2016   Morbid obesity (HCC) 06/01/2021   Multiple thyroid  nodules 04/2013   3 nodules per pt   Polyp of cervix 04/27/2021   Routine general medical examination at a health care facility 08/23/2013   Past Surgical History:  Procedure Laterality Date   APPENDECTOMY     teenager   FRACTURE SURGERY Right    compound fracture of leg per pt   HARDWARE REMOVAL     a month after fracture  surgery   THYROIDECTOMY N/A 10/08/2013   Procedure: TOTAL THYROIDECTOMY;  Surgeon: Krystal CHRISTELLA Spinner, MD;  Location: [facility];  Service: General;  Laterality: N/A;   TONSILLECTOMY AND ADENOIDECTOMY  2012   Social History   Socioeconomic History   Marital status: Single    Spouse name: Not on file   Number of children: Not on file   Years of education: Not on file   Highest education level: Bachelor's degree (e.g., BA, AB, BS)  Occupational History   Not on file  Tobacco Use   Smoking status: Never   Smokeless tobacco: Never  Vaping Use   Vaping status: Never Used  Substance and Sexual Activity   Alcohol use: Yes     Comment: occasional   Drug use: No   Sexual activity: Yes    Partners: Male    Birth control/protection: None  Other Topics Concern   Not on file  Social History Narrative   Grew up in [place]    Bachelors in Social Work   Works for [employer] with her family   Enjoys movies, meals with family, water parks with family.   Family relocated together from [facility] exercise: walk 2 times a wk around the trackCaffeine use: sometimes   Social Drivers of Corporate investment banker Strain: Low Risk  (05/27/2024)   Overall Financial Resource Strain (CARDIA)    Difficulty of Paying Living Expenses: Not hard at all  Food Insecurity: No Food Insecurity (05/27/2024)   Hunger Vital Sign    Worried About Running Out of Food in the Last Year: Never true    Ran Out of Food in the Last Year: Never true  Transportation Needs: No Transportation Needs (05/27/2024)   PRAPARE - Administrator, Civil Service (Medical): No    Lack of Transportation (Non-Medical): No  Physical Activity: Sufficiently Active (05/27/2024)   Exercise Vital Sign    Days of Exercise per Week: 3 days    Minutes of Exercise per Session: 60 min  Stress: No Stress Concern Present (05/27/2024)   [employer] of Occupational Health - Occupational Stress Questionnaire    Feeling of Stress: Not at all  Social Connections: Socially Isolated (05/27/2024)   Social Connection and Isolation Panel    Frequency of Communication with Friends and Family: More than three times a week    Frequency of Social Gatherings with Friends and Family: Once a week    Attends Religious Services: Never    Database administrator or Organizations: No    Attends Engineer, structural: Not on file    Marital Status: Never married  Intimate Partner Violence: Not on file   Current Outpatient Medications on File Prior to Visit  Medication Sig Dispense Refill   amLODipine  (NORVASC ) 5 MG tablet TAKE 1 TABLET (5 MG TOTAL) BY MOUTH DAILY.  90 tablet 0   ferrous sulfate 325 (65 FE) MG EC tablet Take 325 mg by mouth daily.     levothyroxine  (SYNTHROID ) 175 MCG tablet TAKE 1 TABLET BY MOUTH DAILY BEFORE BREAKFAST. 90 tablet 0   No current facility-administered medications on file prior to visit.   Allergies  Allergen Reactions   Penicillins Rash    Has patient had a PCN reaction causing immediate rash, facial/tongue/throat swelling, SOB or lightheadedness with hypotension: Y Has patient had a PCN reaction causing severe rash involving mucus membranes or skin necrosis: Y Has patient had a PCN reaction that required hospitalization: N Has patient  had a PCN reaction occurring within the last 10 years: Y If all of the above answers are NO, then may proceed with Cephalosporin use.    Family History  Problem Relation Age of Onset   Thyroid  disease Mother         had thyroid  removed due to nodules per pt   Hypertension Mother    Cancer - Lung Mother        smoker, died from complication of blood clot to smal intestine   Hyperlipidemia Father    Hypertension Father    Diabetes Father    Cancer Father        appendiceal cancer with partial colectomy   Alcoholism Maternal Grandfather    Cancer Paternal Grandmother        lung   Hyperlipidemia Paternal Grandmother    Hypertension Paternal Grandmother    Diabetes Paternal Grandmother    PE: BP 138/80   Pulse 92   Ht 5' 8 (1.727 m)   Wt (!) 325 lb 12.8 oz (147.8 kg)   SpO2 99%   BMI 49.54 kg/m    Wt Readings from Last 5 Encounters:  08/16/24 (!) 325 lb 12.8 oz (147.8 kg)  05/27/24 (!) 320 lb (145.2 kg)  08/15/23 (!) 312 lb 6.4 oz (141.7 kg)  05/20/23 (!) 313 lb (142 kg)  02/01/23 (!) 309 lb (140.2 kg)   Constitutional: overweight, in NAD Eyes:  EOMI, no exophthalmos ENT: no neck masses, no cervical lymphadenopathy Cardiovascular: RRR, No MRG Respiratory: CTA B Musculoskeletal: no deformities Skin:no rashes Neurological: no tremor with outstretched  hands  ASSESSMENT: 1.  Postsurgical hypothyroidism  - thyroid  ultrasound at [facility] 09/09/2012: Right lobe is 7.3 x 2.5 x 2.2 cm. On the right, there is a solid 2.2 x 1.2 x 1.6 cm mass. The second mass measures 1.0 x 1.2 x 1.2 cm. A third mass measures 1.6 x 1.1 x 1.3 cm. There are several subcentimeter masses on the right as well. Left lobe is 8.0 x 3.8 x 2.8 cm There is a mass in the left lobe measuring 5.0 x 4.9 x 3.9 cm, with few tiny calcifications. Isthmus is 8 mm in thickness.  In the isthmus, there is a mass measuring 1.8 x 1.7 x 1.4 cm all masses are solid. Enlarged thyroid  with multiple solid masses.  - thyroid  FNA (10/2012):  left sided 4 cm nodule: benign, with Hurthle cell and cystic changes right-sided 2 cm nodule: benign, with Hurthle cell and cystic changes  - Ba swallow 08/27/2013: Slight impression on the anterior lower cervical esophagus, possibly related to the patient's known multinodular goiter. Brief sticking of the 13 mm barium tablet in the cervical esophagus.  - total thyroidectomy 10/08/2013 >> benign pathology  Plan: 1.  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  Final pathology was benign - latest thyroid  labs reviewed with pt. >> normal: Lab Results  Component Value Date   TSH 1.87 08/15/2023  - she continues on LT4 175 mcg daily - pt feels good on this dose. - we discussed about taking the thyroid  hormone every day, with water, >30 minutes before breakfast, separated by >4 hours from acid reflux medications, calcium , iron, multivitamins. Pt. is taking it correctly. - will check thyroid  tests today: TSH and fT4 - If labs are abnormal, she will need to return for repeat TFTs in 1.5 months - OTW, I will see her back in a year  Needs refills - [facility]  [address]  Office Visit on 08/16/2024  Component Date Value Ref Range Status   TSH 08/16/2024 2.21  mIU/L Final   Comment:            Reference Range .           > or = 20 Years  0.40-4.50 .                Pregnancy Ranges           First trimester    0.26-2.66           Second trimester   0.55-2.73           Third trimester    0.43-2.91    Free T4 08/16/2024 1.6  0.8 - 1.8 ng/dL Final  TFTs are normal.  Requested Prescriptions   Signed Prescriptions Disp Refills   levothyroxine  (SYNTHROID ) 175 MCG tablet 90 tablet 3    Sig: Take 1 tablet (175 mcg total) by mouth daily before breakfast.   Lela Fendt, MD PhD [facility]

## 2024-08-17 ENCOUNTER — Ambulatory Visit: Payer: Self-pay | Admitting: Internal Medicine

## 2024-08-17 LAB — TSH: TSH: 2.21 m[IU]/L

## 2024-08-17 LAB — T4, FREE: Free T4: 1.6 ng/dL (ref 0.8–1.8)

## 2024-08-17 MED ORDER — LEVOTHYROXINE SODIUM 175 MCG PO TABS: 175.0000 ug | ORAL_TABLET | Freq: Every day | ORAL | 3 refills | Status: AC

## 2024-11-16 ENCOUNTER — Other Ambulatory Visit: Payer: Self-pay | Admitting: Family

## 2025-08-18 ENCOUNTER — Ambulatory Visit: Admitting: Internal Medicine
